# Patient Record
Sex: Male | Born: 1985 | Race: White | Hispanic: No | Smoking: Never smoker
Health system: Southern US, Community
[De-identification: ages and names within clinical notes are randomized; demographics above are authoritative.]

## PROBLEM LIST (undated history)

## (undated) DIAGNOSIS — R109 Unspecified abdominal pain: Secondary | ICD-10-CM

## (undated) DIAGNOSIS — N201 Calculus of ureter: Secondary | ICD-10-CM

## (undated) DIAGNOSIS — K5900 Constipation, unspecified: Secondary | ICD-10-CM

## (undated) DIAGNOSIS — N1330 Unspecified hydronephrosis: Secondary | ICD-10-CM

## (undated) DIAGNOSIS — Z87442 Personal history of urinary calculi: Secondary | ICD-10-CM

## (undated) DIAGNOSIS — G43909 Migraine, unspecified, not intractable, without status migrainosus: Secondary | ICD-10-CM

## (undated) HISTORY — PX: NASAL SEPTOPLASTY W/ TURBINOPLASTY: SHX2070

## (undated) HISTORY — PX: WISDOM TOOTH EXTRACTION: SHX21

---
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
## 2009-04-19 ENCOUNTER — Ambulatory Visit: Payer: Self-pay | Admitting: Radiology

## 2009-04-19 ENCOUNTER — Ambulatory Visit (HOSPITAL_BASED_OUTPATIENT_CLINIC_OR_DEPARTMENT_OTHER): Admission: RE | Admit: 2009-04-19 | Discharge: 2009-04-19 | Payer: Self-pay | Admitting: Family Medicine

## 2009-09-06 ENCOUNTER — Ambulatory Visit: Payer: Self-pay | Admitting: Diagnostic Radiology

## 2009-09-06 ENCOUNTER — Ambulatory Visit (HOSPITAL_BASED_OUTPATIENT_CLINIC_OR_DEPARTMENT_OTHER): Admission: RE | Admit: 2009-09-06 | Discharge: 2009-09-06 | Payer: Self-pay | Admitting: Family Medicine

## 2015-02-26 ENCOUNTER — Emergency Department
Admission: EM | Admit: 2015-02-26 | Discharge: 2015-02-26 | Disposition: A | Discharge status: Home / Self Care | Payer: Managed Care, Other (non HMO) | Source: Home / Self Care | Attending: Family Medicine | Admitting: Family Medicine

## 2015-02-26 ENCOUNTER — Encounter: Payer: Self-pay | Admitting: *Deleted

## 2015-02-26 DIAGNOSIS — J069 Acute upper respiratory infection, unspecified: Secondary | ICD-10-CM | POA: Diagnosis not present

## 2015-02-26 MED ORDER — BENZONATATE 100 MG PO CAPS: 100.0000 mg | ORAL_CAPSULE | Freq: Three times a day (TID) | ORAL | Status: DC

## 2015-02-26 MED ORDER — AZITHROMYCIN 250 MG PO TABS: 250.0000 mg | ORAL_TABLET | Freq: Every day | ORAL | Status: DC

## 2015-02-26 MED ORDER — DM-GUAIFENESIN ER 30-600 MG PO TB12: 1.0000 | ORAL_TABLET | Freq: Two times a day (BID) | ORAL | Status: DC

## 2015-02-26 NOTE — Discharge Instructions (Signed)
You may continue to try conservative treatment with Mucinex DM, Tessalon, and ibuprofen for fever or pain.  If symptoms are persistent, you may start the antibiotic, Azithromycin, in 4-5 days, if symptoms worsening including worsening cough, chest pain, or fever, you may start the antibiotic sooner.  Please take antibiotics as prescribed and be sure to complete entire course even if you start to feel better to ensure infection does not come back.   Upper Respiratory Infection, Adult Most upper respiratory infections (URIs) are a viral infection of the air passages leading to the lungs. A URI affects the nose, throat, and upper air passages. The most common type of URI is nasopharyngitis and is typically referred to as "the common cold." URIs run their course and usually go away on their own. Most of the time, a URI does not require medical attention, but sometimes a bacterial infection in the upper airways can follow a viral infection. This is called a secondary infection. Sinus and middle ear infections are common types of secondary upper respiratory infections. Bacterial pneumonia can also complicate a URI. A URI can worsen asthma and chronic obstructive pulmonary disease (COPD). Sometimes, these complications can require emergency medical care and may be life threatening.  CAUSES Almost all URIs are caused by viruses. A virus is a type of germ and can spread from one person to another.  RISKS FACTORS You may be at risk for a URI if:   You smoke.   You have chronic heart or lung disease.  You have a weakened defense (immune) system.   You are very young or very old.   You have nasal allergies or asthma.  You work in crowded or poorly ventilated areas.  You work in health care facilities or schools. SIGNS AND SYMPTOMS  Symptoms typically develop 2-3 days after you come in contact with a cold virus. Most viral URIs last 7-10 days. However, viral URIs from the influenza virus (flu  virus) can last 14-18 days and are typically more severe. Symptoms may include:   Runny or stuffy (congested) nose.   Sneezing.   Cough.   Sore throat.   Headache.   Fatigue.   Fever.   Loss of appetite.   Pain in your forehead, behind your eyes, and over your cheekbones (sinus pain).  Muscle aches.  DIAGNOSIS  Your health care provider may diagnose a URI by:  Physical exam.  Tests to check that your symptoms are not due to another condition such as:  Strep throat.  Sinusitis.  Pneumonia.  Asthma. TREATMENT  A URI goes away on its own with time. It cannot be cured with medicines, but medicines may be prescribed or recommended to relieve symptoms. Medicines may help:  Reduce your fever.  Reduce your cough.  Relieve nasal congestion. HOME CARE INSTRUCTIONS   Take medicines only as directed by your health care provider.   Gargle warm saltwater or take cough drops to comfort your throat as directed by your health care provider.  Use a warm mist humidifier or inhale steam from a shower to increase air moisture. This may make it easier to breathe.  Drink enough fluid to keep your urine clear or pale yellow.   Eat soups and other clear broths and maintain good nutrition.   Rest as needed.   Return to work when your temperature has returned to normal or as your health care provider advises. You may need to stay home longer to avoid infecting others. You can also use a  face mask and careful hand washing to prevent spread of the virus.  Increase the usage of your inhaler if you have asthma.   Do not use any tobacco products, including cigarettes, chewing tobacco, or electronic cigarettes. If you need help quitting, ask your health care provider. PREVENTION  The best way to protect yourself from getting a cold is to practice good hygiene.   Avoid oral or hand contact with people with cold symptoms.   Wash your hands often if contact occurs.   There is no clear evidence that vitamin C, vitamin E, echinacea, or exercise reduces the chance of developing a cold. However, it is always recommended to get plenty of rest, exercise, and practice good nutrition.  SEEK MEDICAL CARE IF:   You are getting worse rather than better.   Your symptoms are not controlled by medicine.   You have chills.  You have worsening shortness of breath.  You have brown or red mucus.  You have yellow or brown nasal discharge.  You have pain in your face, especially when you bend forward.  You have a fever.  You have swollen neck glands.  You have pain while swallowing.  You have white areas in the back of your throat. SEEK IMMEDIATE MEDICAL CARE IF:   You have severe or persistent:  Headache.  Ear pain.  Sinus pain.  Chest pain.  You have chronic lung disease and any of the following:  Wheezing.  Prolonged cough.  Coughing up blood.  A change in your usual mucus.  You have a stiff neck.  You have changes in your:  Vision.  Hearing.  Thinking.  Mood. MAKE SURE YOU:   Understand these instructions.  Will watch your condition.  Will get help right away if you are not doing well or get worse.   This information is not intended to replace advice given to you by your health care provider. Make sure you discuss any questions you have with your health care provider.   Document Released: 09/09/2000 Document Revised: 07/31/2014 Document Reviewed: 06/21/2013 Elsevier Interactive Patient Education 2016 [employer].  [employer] Vaporizers Vaporizers may help relieve the symptoms of a cough and cold. They add moisture to the air, which helps mucus to become thinner and less sticky. This makes it easier to breathe and cough up secretions. Cool mist vaporizers do not cause serious burns like hot mist vaporizers, which may also be called steamers or humidifiers. Vaporizers have not been proven to help with colds. You should  not use a vaporizer if you are allergic to mold. HOME CARE INSTRUCTIONS  Follow the package instructions for the vaporizer.  Do not use anything other than distilled water in the vaporizer.  Do not run the vaporizer all of the time. This can cause mold or bacteria to grow in the vaporizer.  Clean the vaporizer after each time it is used.  Clean and dry the vaporizer well before storing it.  Stop using the vaporizer if worsening respiratory symptoms develop.   This information is not intended to replace advice given to you by your health care provider. Make sure you discuss any questions you have with your health care provider.   Document Released: 12/12/2003 Document Revised: 03/21/2013 Document Reviewed: 08/03/2012 Elsevier Interactive Patient Education [employer]

## 2015-02-26 NOTE — ED Notes (Signed)
Pt c/o productive cough with some body aches x 5 days. He reports a fever the first 2 days.

## 2015-02-26 NOTE — ED Provider Notes (Signed)
CSN: [id number]     Arrival date & time 02/26/15  [id number] History   First MD Initiated Contact with Patient 02/26/15 [phone]     Chief Complaint  Patient presents with  . Cough   (Consider location/radiation/quality/duration/timing/severity/associated sxs/prior Treatment) HPI Pt is a 29yo male [facility] with c/o persistent mild to moderately productive cough for 5 days with associated body aches and "low grade fever" for 2 days.   Pt also c/o sore throat and a hoarse voice. Cough is most bothersome for pt as it causes near post-tussive vomiting.  He did try Mucinex once yesterday with no relief. He has also been taking motrin with minimal relief. Denies vomiting or diarrhea. Denies hx of asthma. No chest pain or SOB. Pt's girlfriend is also here to be seen for similar symptoms but pt states his girlfriend is sicker than he is. Denies recent travel.   History reviewed. No pertinent past medical history. Past Surgical History  Procedure Laterality Date  . Wisdom tooth extraction    . Nasal sinus surgery     Family History  Problem Relation Age of Onset  . Hypertension Mother    Social History  Substance Use Topics  . Smoking status: Never Smoker   . Smokeless tobacco: None  . Alcohol Use: Yes    Review of Systems  Constitutional: Positive for fever, chills and fatigue.  HENT: Positive for congestion, ear pain ( bilateral), postnasal drip, rhinorrhea, sinus pressure, sneezing, sore throat and voice change. Negative for trouble swallowing.   Respiratory: Positive for cough. Negative for shortness of breath.   Cardiovascular: Negative for chest pain and palpitations.  Gastrointestinal: Negative for nausea, vomiting, abdominal pain and diarrhea.  Musculoskeletal: Positive for myalgias and arthralgias. Negative for back pain.  Skin: Negative for rash.  Neurological: Positive for headaches. Negative for dizziness and light-headedness.    Allergies  Review of patient's allergies  indicates no known allergies.  Home Medications   Prior to Admission medications   Medication Sig Start Date End Date Taking? Authorizing Provider  ibuprofen (ADVIL,MOTRIN) 200 MG tablet Take 200 mg by mouth every 6 (six) hours as needed.   Yes Historical Provider, MD  azithromycin (ZITHROMAX) 250 MG tablet Take 1 tablet (250 mg total) by mouth daily. Take first 2 tablets together, then 1 every day until finished. 02/26/15   Junius FinnerErin O'Malley, PA-C  benzonatate (TESSALON) 100 MG capsule Take 1 capsule (100 mg total) by mouth every 8 (eight) hours. 02/26/15   Junius FinnerErin O'Malley, PA-C  dextromethorphan-guaiFENesin (MUCINEX DM) 30-600 MG 12hr tablet Take 1 tablet by mouth 2 (two) times daily. 02/26/15   Junius FinnerErin O'Malley, PA-C   Meds Ordered and Administered this Visit  Medications - No data to display  BP 118/79 mmHg  Pulse 84  Temp(Src) 97.8 F (36.6 C) (Oral)  Resp 18  Ht 5\' 10"  (1.778 m)  Wt 251 lb (113.853 kg)  BMI 36.01 kg/m2  SpO2 97% No data found.   Physical Exam  Constitutional: He appears well-developed and well-nourished.  HENT:  Head: Normocephalic and atraumatic.  Right Ear: Hearing, tympanic membrane, external ear and ear canal normal.  Left Ear: Hearing, tympanic membrane, external ear and ear canal normal.  Nose: Mucosal edema present. Right sinus exhibits no maxillary sinus tenderness and no frontal sinus tenderness. Left sinus exhibits no maxillary sinus tenderness and no frontal sinus tenderness.  Mouth/Throat: Uvula is midline and mucous membranes are normal. Posterior oropharyngeal erythema present. No oropharyngeal exudate, posterior oropharyngeal edema or tonsillar  abscesses.  Eyes: Conjunctivae are normal. No scleral icterus.  Neck: Normal range of motion. Neck supple.  Cardiovascular: Normal rate, regular rhythm and normal heart sounds.   Pulmonary/Chest: Effort normal and breath sounds normal. No respiratory distress. He has no wheezes. He has no rales. He exhibits no  tenderness.  Intermittent mildly productive cough on exam. No respiratory distress.  Abdominal: Soft. He exhibits no distension and no mass. There is no tenderness. There is no rebound and no guarding.  Musculoskeletal: Normal range of motion.  Lymphadenopathy:    He has no cervical adenopathy.  Neurological: He is alert.  Skin: Skin is warm and dry.  Nursing note and vitals reviewed.   ED Course  Procedures (including critical care time)  Labs Review Labs Reviewed - No data to display  Imaging Review No results found.    MDM   1. Acute upper respiratory infection    Pt c/o cough for 5 days with associated body aches and sore throat. Sick contacts. No recent travel. Vitals: WNL  Symptoms likely viral in nature, however, may be developing into a bacterial infection. Rx: Tessalon and Mucinex DM. Advised he may continue to take Motrin.  Prescribed Azithromycin for pt to start in 4-5 days if not improving, sooner if symptoms worsening such as chest pain, fever, or worsening cough. F/u with PCP in 7-10 days if not improving. Patient verbalized understanding and agreement with treatment plan.    Junius Finner, PA-C 02/26/15 [phone]

## 2015-05-31 ENCOUNTER — Emergency Department
Admission: EM | Admit: 2015-05-31 | Discharge: 2015-05-31 | Discharge status: Absent without leave | Payer: Managed Care, Other (non HMO) | Source: Home / Self Care

## 2015-06-01 ENCOUNTER — Emergency Department
Admission: EM | Admit: 2015-06-01 | Discharge: 2015-06-01 | Disposition: A | Discharge status: Home / Self Care | Payer: Managed Care, Other (non HMO) | Source: Home / Self Care | Attending: Family Medicine | Admitting: Family Medicine

## 2015-06-01 ENCOUNTER — Encounter: Payer: Self-pay | Admitting: Emergency Medicine

## 2015-06-01 DIAGNOSIS — B9789 Other viral agents as the cause of diseases classified elsewhere: Principal | ICD-10-CM

## 2015-06-01 DIAGNOSIS — J069 Acute upper respiratory infection, unspecified: Secondary | ICD-10-CM | POA: Diagnosis not present

## 2015-06-01 MED ORDER — GUAIFENESIN-CODEINE 100-10 MG/5ML PO SOLN: ORAL | Status: DC

## 2015-06-01 MED ORDER — AZITHROMYCIN 250 MG PO TABS: 250.0000 mg | ORAL_TABLET | Freq: Every day | ORAL | Status: DC

## 2015-06-01 NOTE — Discharge Instructions (Signed)
Take plain guaifenesin (1200mg  extended release tabs such as Mucinex) twice daily, with plenty of water, for cough and congestion.  May add Pseudoephedrine (30mg , one or two every 4 to 6 hours) for sinus congestion.  Get adequate rest.   Try warm salt water gargles for sore throat.  Stop all antihistamines for now, and other non-prescription cough/cold preparations. May take Ibuprofen 200mg , 4 tabs every 8 hours with food for chest/sternum discomfort. Begin Azithromycin if not improving about one week or if persistent fever develops (Given a prescription to hold, with an expiration date)  Follow-up with family doctor if not improving about10 days.

## 2015-06-01 NOTE — ED Provider Notes (Signed)
CSN: [id number]     Arrival date & time 06/01/15  [id number] History   First MD Initiated Contact with Patient 06/01/15 [phone]     Chief Complaint  Patient presents with  . Nasal Congestion  . Cough  . Generalized Body Aches      HPI Comments: Patient complains of six day history of typical cold-like symptoms developing over several days,  including mild sore throat, headache, fatigue, and cough.  He has had minimal sinus congestion. The cough persists, and he complains of tightness in his anterior chest.  He denies fevers, chills, and sweats.  The history is provided by the patient.    Past Medical History  Diagnosis Date  . Thyroid disease    Past Surgical History  Procedure Laterality Date  . Wisdom tooth extraction    . Nasal sinus surgery     Family History  Problem Relation Age of Onset  . Hypertension Mother   . Diabetes Mother    Social History  Substance Use Topics  . Smoking status: Never Smoker   . Smokeless tobacco: None  . Alcohol Use: Yes    Review of Systems + sore throat + cough No pleuritic pain ? wheezing + nasal congestion + post-nasal drainage No sinus pain/pressure No itchy/red eyes No earache No hemoptysis ? SOB No fever/chills No nausea No vomiting No abdominal pain No diarrhea No urinary symptoms No skin rash + fatigue No myalgias + headache Used OTC meds without relief  Allergies  Review of patient's allergies indicates no known allergies.  Home Medications   Prior to Admission medications   Medication Sig Start Date End Date Taking? Authorizing Provider  azithromycin (ZITHROMAX) 250 MG tablet Take 1 tablet (250 mg total) by mouth daily. Take two tabs together on day 1, then 1 daily on days 2 through 5 (Rx void after 06/09/15) 06/01/15   Lattie Haw, MD  guaiFENesin-codeine 100-10 MG/5ML syrup Take 10mL by mouth at bedtime as needed for cough 06/01/15   Lattie Haw, MD  ibuprofen (ADVIL,MOTRIN) 200 MG tablet Take 200 mg by mouth  every 6 (six) hours as needed.    Historical Provider, MD   Meds Ordered and Administered this Visit  Medications - No data to display  BP 113/80 mmHg  Pulse 85  Temp(Src) 97.7 F (36.5 C) (Oral)  Resp 16  Ht  (1.778 m)  Wt 243 lb (110.224 kg)  BMI 34.87 kg/m2  SpO2 96% No data found.   Physical Exam Nursing notes and Vital Signs reviewed. Appearance:  Patient appears stated age, and in no acute distress.  Patient is obese (BMI 34.9) Eyes:  Pupils are equal, round, and reactive to light and accomodation.  Extraocular movement is intact.  Conjunctivae are not inflamed  Ears:  Canals normal.  Tympanic membranes normal.  Nose:  Congested turbinates.  No sinus tenderness.    Pharynx:   Mild swelling uvula Neck:  Supple.  Tender enlarged posterior nodes are palpated bilaterally  Lungs:  Clear to auscultation.  Breath sounds are equal.  Moving air well. Heart:  Regular rate and rhythm without murmurs, rubs, or gallops.  Abdomen:  Nontender without masses or hepatosplenomegaly.  Bowel sounds are present.  No CVA or flank tenderness.  Extremities:  No edema.  Skin:  No rash present.   ED Course  Procedures none    MDM   1. Viral URI with cough    There is no evidence of bacterial infection today.  Rx  for Robitussin AC for night time cough.  Take plain guaifenesin (1200mg  extended release tabs such as Mucinex) twice daily, with plenty of water, for cough and congestion.  May add Pseudoephedrine (30mg , one or two every 4 to 6 hours) for sinus congestion.  Get adequate rest.   Try warm salt water gargles for sore throat.  Stop all antihistamines for now, and other non-prescription cough/cold preparations. May take Ibuprofen 200mg , 4 tabs every 8 hours with food for chest/sternum discomfort. Begin Azithromycin if not improving about one week or if persistent fever develops (Given a prescription to hold, with an expiration date)  Follow-up with family doctor if not improving  about10 days.     Lattie HawStephen A Kahliyah Dick, MD 06/01/15 1002

## 2015-06-01 NOTE — ED Notes (Signed)
Reports 6 day history of congestion and cough not responding to OTCS; some aches and sore throat; no documented fever. No OTCs today.

## 2017-01-03 ENCOUNTER — Encounter (HOSPITAL_BASED_OUTPATIENT_CLINIC_OR_DEPARTMENT_OTHER): Payer: Self-pay | Admitting: *Deleted

## 2017-01-03 ENCOUNTER — Emergency Department (HOSPITAL_BASED_OUTPATIENT_CLINIC_OR_DEPARTMENT_OTHER)
Admission: EM | Admit: 2017-01-03 | Discharge: 2017-01-03 | Disposition: A | Discharge status: Home / Self Care | Payer: Commercial Managed Care - PPO | Attending: Emergency Medicine | Admitting: Emergency Medicine

## 2017-01-03 ENCOUNTER — Emergency Department (HOSPITAL_BASED_OUTPATIENT_CLINIC_OR_DEPARTMENT_OTHER): Payer: Commercial Managed Care - PPO

## 2017-01-03 DIAGNOSIS — R112 Nausea with vomiting, unspecified: Secondary | ICD-10-CM | POA: Diagnosis not present

## 2017-01-03 DIAGNOSIS — N132 Hydronephrosis with renal and ureteral calculous obstruction: Secondary | ICD-10-CM | POA: Diagnosis not present

## 2017-01-03 DIAGNOSIS — R109 Unspecified abdominal pain: Secondary | ICD-10-CM | POA: Diagnosis present

## 2017-01-03 LAB — CBC
HEMATOCRIT: 44.4 % (ref 39.0–52.0)
Hemoglobin: 15.6 g/dL (ref 13.0–17.0)
MCH: 29.5 pg (ref 26.0–34.0)
MCHC: 35.1 g/dL (ref 30.0–36.0)
MCV: 84.1 fL (ref 78.0–100.0)
PLATELETS: 358 10*3/uL (ref 150–400)
RBC: 5.28 MIL/uL (ref 4.22–5.81)
RDW: 12.8 % (ref 11.5–15.5)
WBC: 8.2 10*3/uL (ref 4.0–10.5)

## 2017-01-03 LAB — URINALYSIS, ROUTINE W REFLEX MICROSCOPIC

## 2017-01-03 LAB — COMPREHENSIVE METABOLIC PANEL
ALT: 34 U/L (ref 17–63)
AST: 33 U/L (ref 15–41)
Albumin: 4.7 g/dL (ref 3.5–5.0)
Alkaline Phosphatase: 56 U/L (ref 38–126)
Anion gap: 8 (ref 5–15)
BILIRUBIN TOTAL: 0.5 mg/dL (ref 0.3–1.2)
BUN: 15 mg/dL (ref 6–20)
CALCIUM: 9.2 mg/dL (ref 8.9–10.3)
CHLORIDE: 106 mmol/L (ref 101–111)
CO2: 24 mmol/L (ref 22–32)
Creatinine, Ser: 0.92 mg/dL (ref 0.61–1.24)
GLUCOSE: 106 mg/dL — AB (ref 65–99)
POTASSIUM: 3.3 mmol/L — AB (ref 3.5–5.1)
Sodium: 138 mmol/L (ref 135–145)
Total Protein: 7.5 g/dL (ref 6.5–8.1)

## 2017-01-03 LAB — LIPASE, BLOOD: Lipase: 38 U/L (ref 11–51)

## 2017-01-03 LAB — URINALYSIS, MICROSCOPIC (REFLEX)

## 2017-01-03 MED ORDER — ONDANSETRON 4 MG PO TBDP: 4.0000 mg | ORAL_TABLET | Freq: Three times a day (TID) | ORAL | 0 refills | Status: AC | PRN

## 2017-01-03 MED ORDER — KETOROLAC TROMETHAMINE 30 MG/ML IJ SOLN
30.0000 mg | Freq: Once | INTRAMUSCULAR | Status: AC
  Administered 2017-01-03: 30 mg via INTRAVENOUS
  Filled 2017-01-03: qty 1

## 2017-01-03 MED ORDER — CEPHALEXIN 500 MG PO CAPS: 500.0000 mg | ORAL_CAPSULE | Freq: Four times a day (QID) | ORAL | 0 refills | Status: AC

## 2017-01-03 MED ORDER — HYDROMORPHONE HCL 1 MG/ML IJ SOLN
1.0000 mg | Freq: Once | INTRAMUSCULAR | Status: AC
  Administered 2017-01-03: 1 mg via INTRAVENOUS
  Filled 2017-01-03: qty 1

## 2017-01-03 MED ORDER — SODIUM CHLORIDE 0.9 % IV BOLUS (SEPSIS)
1000.0000 mL | Freq: Once | INTRAVENOUS | Status: AC
  Administered 2017-01-03: 1000 mL via INTRAVENOUS

## 2017-01-03 MED ORDER — KETOROLAC TROMETHAMINE 10 MG PO TABS: 10.0000 mg | ORAL_TABLET | Freq: Four times a day (QID) | ORAL | 0 refills | Status: AC | PRN

## 2017-01-03 MED ORDER — ONDANSETRON HCL 4 MG/2ML IJ SOLN
4.0000 mg | Freq: Once | INTRAMUSCULAR | Status: AC
  Administered 2017-01-03: 4 mg via INTRAVENOUS
  Filled 2017-01-03: qty 2

## 2017-01-03 NOTE — ED Triage Notes (Signed)
Right flank pain radiating into right testicle x 2 days. Denies urinary symptoms. Last BM today. Denies frank blood in urine. Vomit x 2

## 2017-01-03 NOTE — ED Notes (Signed)
ED Provider at bedside. 

## 2017-01-03 NOTE — ED Notes (Signed)
Patient claimed that he could not get comfortable.

## 2017-01-03 NOTE — ED Provider Notes (Signed)
[facility] Provider Note   CSN: [id number] Arrival date & time: 01/03/17  1323     History   Chief Complaint Chief Complaint  Patient presents with  . Flank Pain    HPI Russell Gilbert is a 31 y.o. male who presents to the Emergency Department with right flank pain that began as mild, dull pain 2 days ago, but suddenly significantly worsened this afternoon as constant, sharp, severe pain. He also complains of several episodes of non-bilious, non-bloody emesis and nausea since the pain worsened. The pain radiates to the right groin. He denies fever, chills, abdominal pain, back pain, dysuria, frequency, diarrhea, hematuria, testicular or penile pain or swelling, or penile discharge. No left sided symptoms. He has taken ibuprofen without relief. No h/o of similar sx.  No pertinent PMH. No h/o of nephrolithiasis. He reports a family hx of nephrolithiasis with his mother.   The history is provided by the patient. No language interpreter was used.    Past Medical History:  Diagnosis Date  . Constipation   . Hydronephrosis, right   . Migraine   . Right flank pain   . Right ureteral stone     There are no active problems to display for this patient.   Past Surgical History:  Procedure Laterality Date  . NASAL SEPTOPLASTY W/ TURBINOPLASTY  01-10-2015   dr d. Christell Constant @ [facility]  . WISDOM TOOTH EXTRACTION         Home Medications    Prior to Admission medications   Medication Sig Start Date End Date Taking? Authorizing Provider  acetaminophen (TYLENOL) 500 MG tablet Take 500 mg by mouth every 6 (six) hours as needed.    [provider]  cephALEXin (KEFLEX) 500 MG capsule Take 1 capsule (500 mg total) by mouth 4 (four) times daily. 01/03/17 01/08/17  McDonald, Mia A, PA-C  Ibuprofen (ADVIL) 200 MG CAPS Take by mouth as needed.    [provider]  ketorolac (TORADOL) 10 MG tablet Take 1 tablet (10 mg total) by mouth every 6 (six) hours as needed. 01/03/17    McDonald, Mia A, PA-C  ondansetron (ZOFRAN ODT) 4 MG disintegrating tablet Take 1 tablet (4 mg total) by mouth every 8 (eight) hours as needed for nausea or vomiting. 01/03/17   McDonald, Mia A, PA-C    Family History Family History  Problem Relation Age of Onset  . Hypertension Mother   . Diabetes Mother     Social History Social History  Substance Use Topics  . Smoking status: Never Smoker  . Smokeless tobacco: Never Used  . Alcohol use Yes     Comment: occasional     Allergies   Patient has no known allergies.   Review of Systems Review of Systems  Constitutional: Negative for activity change, chills and fever.  Respiratory: Negative for shortness of breath.   Cardiovascular: Negative for chest pain.  Gastrointestinal: Positive for nausea and vomiting. Negative for abdominal pain and diarrhea.  Genitourinary: Positive for flank pain. Negative for decreased urine volume, discharge, dysuria, frequency, hematuria, penile pain, penile swelling, scrotal swelling, testicular pain and urgency.  Musculoskeletal: Negative for back pain.  Skin: Negative for rash.     Physical Exam Updated Vital Signs BP 132/88 (BP Location: Right Wrist)   Pulse 68   Temp 98.2 F (36.8 C) (Oral)   Resp 18   Ht  (1.803 m)   Wt 117.9 kg (260 lb)   SpO2 98%   BMI 36.26  kg/m   Physical Exam  Constitutional: He appears well-developed.  Grabbing his right flank and changing positions frequently; uncomfortable appearing.   HENT:  Head: Normocephalic.  Eyes: Conjunctivae are normal.  Neck: Neck supple.  Cardiovascular: Normal rate and regular rhythm.   No murmur heard. Pulmonary/Chest: Effort normal.  Abdominal: Soft. He exhibits no distension. No hernia.  TTP over the right flank. No CVA tenderness bilaterally. Minimal RLQ tenderness. No rebound or guarding. Normoactive BS. No left flank tenderness.   Genitourinary:  Genitourinary Comments: Chaperoned exam. No TTP of the penis or  scrotum; no overlying erythema, swelling, or lesions. No inguinal hernias. No palpable inguinal lymphadenopathy. TTP over the right inguinal area.   Neurological: He is alert.  Skin: Skin is warm and dry.  Psychiatric: His behavior is normal.  Nursing note and vitals reviewed.    ED Treatments / Results  Labs (all labs ordered are listed, but only abnormal results are displayed) Labs Reviewed  URINALYSIS, ROUTINE W REFLEX MICROSCOPIC - Abnormal; Notable for the following:       Result Value   Color, Urine BROWN (*)    APPearance TURBID (*)    Glucose, UA   (*)    Value: TEST NOT REPORTED DUE TO COLOR INTERFERENCE OF URINE PIGMENT   Hgb urine dipstick   (*)    Value: TEST NOT REPORTED DUE TO COLOR INTERFERENCE OF URINE PIGMENT   Bilirubin Urine   (*)    Value: TEST NOT REPORTED DUE TO COLOR INTERFERENCE OF URINE PIGMENT   Ketones, ur   (*)    Value: TEST NOT REPORTED DUE TO COLOR INTERFERENCE OF URINE PIGMENT   Protein, ur   (*)    Value: TEST NOT REPORTED DUE TO COLOR INTERFERENCE OF URINE PIGMENT   Nitrite   (*)    Value: TEST NOT REPORTED DUE TO COLOR INTERFERENCE OF URINE PIGMENT   Leukocytes, UA   (*)    Value: TEST NOT REPORTED DUE TO COLOR INTERFERENCE OF URINE PIGMENT   All other components within normal limits  COMPREHENSIVE METABOLIC PANEL - Abnormal; Notable for the following:    Potassium 3.3 (*)    Glucose, Bld 106 (*)    All other components within normal limits  URINALYSIS, MICROSCOPIC (REFLEX) - Abnormal; Notable for the following:    Bacteria, UA MANY (*)    Squamous Epithelial / LPF 0-5 (*)    All other components within normal limits  CBC  LIPASE, BLOOD    EKG  EKG Interpretation None       Radiology Ct Renal Stone Study  Result Date: 01/03/2017 CLINICAL DATA:  Right flank pain for 2 days radiating to the right groin EXAM: CT ABDOMEN AND PELVIS WITHOUT CONTRAST TECHNIQUE: Multidetector CT imaging of the abdomen and pelvis was performed following  the standard protocol without IV contrast. COMPARISON:  09/06/2009 unenhanced CT abdomen/pelvis. FINDINGS: Lower chest: No significant pulmonary nodules or acute consolidative airspace disease. Hepatobiliary: Normal liver size. Subcentimeter hypodense posterior right liver lobe lesion (Series 2/image 21), too small to characterize, not definitely seen on the prior unenhanced CT, which requires no follow up unless the patient has risk factors for liver malignancy. No additional liver lesions. Normal gallbladder with no radiopaque cholelithiasis. No biliary ductal dilatation. Pancreas: Normal, with no mass or duct dilation. Spleen: Normal size. No mass. Adrenals/Urinary Tract: Normal adrenals. Obstructing 5 mm right lumbar ureteral stone at the L4-5 disc level, with mild to moderate right hydroureteronephrosis, mild asymmetric enlargement of the  right kidney and minimal right perinephric fat stranding. No stones within the renal collecting systems. No left hydronephrosis. No contour deforming renal masses. Normal caliber left ureter. No additional ureteral stones. Collapsed and grossly normal bladder. Stomach/Bowel: Grossly normal stomach. Normal caliber small bowel with no small bowel wall thickening. Normal appendix. Normal large bowel with no diverticulosis, large bowel wall thickening or pericolonic fat stranding. Vascular/Lymphatic: Normal caliber abdominal aorta. No pathologically enlarged lymph nodes in the abdomen or pelvis. Reproductive: Normal size prostate. Other: No pneumoperitoneum, ascites or focal fluid collection. Musculoskeletal: No aggressive appearing focal osseous lesions. IMPRESSION: Obstructing 5 mm right lumbar ureteral stone at the L4-5 disc level, with mild to moderate right hydroureteronephrosis. No additional urolithiasis. Electronically Signed   By: Delbert Phenix M.D.   On: 01/03/2017 15:46    Procedures Procedures (including critical care time)  Medications Ordered in ED Medications    HYDROmorphone (DILAUDID) injection 1 mg (1 mg Intravenous Given 01/03/17 1402)  sodium chloride 0.9 % bolus 1,000 mL (0 mLs Intravenous Stopped 01/03/17 1415)  ondansetron (ZOFRAN) injection 4 mg (4 mg Intravenous Given 01/03/17 1402)  ketorolac (TORADOL) 30 MG/ML injection 30 mg (30 mg Intravenous Given 01/03/17 1502)     Initial Impression / Assessment and Plan / ED Course  I have reviewed the triage vital signs and the nursing notes.  Pertinent labs & imaging results that were available during my care of the patient were reviewed by me and considered in my medical decision making (see chart for details).     31 year old male with no significant PMH with right flank pain for 2 days, significantly worsening today w/ N/V. Dilaudid and zofran given. Nausea and emesis resolved, but the patient's pain was not improved. Cr 0.92. Toradol given with resolution of the patient's pain. Ct stone study with 5 mm right lumbar ureteral stone at L4-5 disc level with mild to moderate right hydroureteronephrosis. UA with many bacteria and calcium oxylate crystals noted. The patient has no dysuria, frequency, hesitancy, or suprapubic pain. No fever or chills. No tachycardia. Doubt infected stone at this time. Will d/c the patient with Toradol and follow up to urology. Discussed the patient's UA with Dr. Denton Lank who recommended giving the patient a prescription of Keflex in case he started developing urinary symptoms prior to being seen by urology. Strict return precautions given. NAD. The patient is safe for d/c at this time.   Final Clinical Impressions(s) / ED Diagnoses   Final diagnoses:  Ureteral stone with hydronephrosis    New Prescriptions Discharge Medication List as of 01/03/2017  5:42 PM    START taking these medications   Details  cephALEXin (KEFLEX) 500 MG capsule Take 1 capsule (500 mg total) by mouth 4 (four) times daily., Starting Sun 01/03/2017, Until Fri 01/08/2017, Print    ketorolac  (TORADOL) 10 MG tablet Take 1 tablet (10 mg total) by mouth every 6 (six) hours as needed., Starting Sun 01/03/2017, Print    ondansetron (ZOFRAN ODT) 4 MG disintegrating tablet Take 1 tablet (4 mg total) by mouth every 8 (eight) hours as needed for nausea or vomiting., Starting Sun 01/03/2017, Print         McDonald, Mia A, PA-C 01/04/17 2053    Arby Barrette, MD 01/05/17 1544

## 2017-01-03 NOTE — Discharge Instructions (Signed)
Call Dr. Shannan Harper office tomorrow morning to schedule a follow-up appointment.  1 tablet of oral Toradol to be taken once every 6 hours as needed for pain. Please do not take more than once every 6 hours. If you are unable to control your pain using this medication, please return to the emergency department for reevaluation. One tablet of Zofran to be taken once every 8 hours as needed for nausea or vomiting.   If he develop new or worsening symptoms, including a fever that is not resolved with Tylenol, chills, nausea or vomiting despite taking Zofran, or uncontrollable pain despite taking Toradol, please return to the emergency department for reevaluation.

## 2017-01-04 ENCOUNTER — Encounter (HOSPITAL_BASED_OUTPATIENT_CLINIC_OR_DEPARTMENT_OTHER): Payer: Self-pay | Admitting: *Deleted

## 2017-01-04 ENCOUNTER — Other Ambulatory Visit: Payer: Self-pay | Admitting: Urology

## 2017-01-04 NOTE — Progress Notes (Signed)
SPOKE W/ PT WIFE.  NPO AFTER MN.  ARRIVE AT 0915.  CURRENT LAB RESULTS IN CHART AND EPIC.  MAY TAKE PRN RX IF NEEDED W/ SIPS OF WATER AM DOS.

## 2017-01-06 ENCOUNTER — Encounter (HOSPITAL_BASED_OUTPATIENT_CLINIC_OR_DEPARTMENT_OTHER): Payer: Self-pay | Admitting: *Deleted

## 2017-01-06 ENCOUNTER — Encounter (HOSPITAL_BASED_OUTPATIENT_CLINIC_OR_DEPARTMENT_OTHER)
Admission: RE | Disposition: A | Discharge status: Home / Self Care | Payer: Self-pay | Source: Ambulatory Visit | Attending: Urology

## 2017-01-06 ENCOUNTER — Ambulatory Visit (HOSPITAL_BASED_OUTPATIENT_CLINIC_OR_DEPARTMENT_OTHER): Payer: Commercial Managed Care - PPO | Admitting: Anesthesiology

## 2017-01-06 ENCOUNTER — Ambulatory Visit (HOSPITAL_BASED_OUTPATIENT_CLINIC_OR_DEPARTMENT_OTHER)
Admission: RE | Admit: 2017-01-06 | Discharge: 2017-01-06 | Disposition: A | Discharge status: Home / Self Care | Payer: Commercial Managed Care - PPO | Source: Ambulatory Visit | Attending: Urology | Admitting: Urology

## 2017-01-06 DIAGNOSIS — K5900 Constipation, unspecified: Secondary | ICD-10-CM | POA: Diagnosis not present

## 2017-01-06 DIAGNOSIS — G43909 Migraine, unspecified, not intractable, without status migrainosus: Secondary | ICD-10-CM | POA: Diagnosis not present

## 2017-01-06 DIAGNOSIS — N132 Hydronephrosis with renal and ureteral calculous obstruction: Secondary | ICD-10-CM | POA: Diagnosis not present

## 2017-01-06 DIAGNOSIS — Z6834 Body mass index (BMI) 34.0-34.9, adult: Secondary | ICD-10-CM | POA: Insufficient documentation

## 2017-01-06 DIAGNOSIS — Z833 Family history of diabetes mellitus: Secondary | ICD-10-CM | POA: Insufficient documentation

## 2017-01-06 DIAGNOSIS — Z8249 Family history of ischemic heart disease and other diseases of the circulatory system: Secondary | ICD-10-CM | POA: Insufficient documentation

## 2017-01-06 DIAGNOSIS — Z87442 Personal history of urinary calculi: Secondary | ICD-10-CM | POA: Diagnosis present

## 2017-01-06 HISTORY — DX: Constipation, unspecified: K59.00

## 2017-01-06 HISTORY — DX: Unspecified hydronephrosis: N13.30

## 2017-01-06 HISTORY — PX: HOLMIUM LASER APPLICATION: SHX5852

## 2017-01-06 HISTORY — DX: Calculus of ureter: N20.1

## 2017-01-06 HISTORY — DX: Unspecified abdominal pain: R10.9

## 2017-01-06 HISTORY — PX: CYSTOSCOPY WITH RETROGRADE PYELOGRAM, URETEROSCOPY AND STENT PLACEMENT: SHX5789

## 2017-01-06 HISTORY — DX: Migraine, unspecified, not intractable, without status migrainosus: G43.909

## 2017-01-06 HISTORY — DX: Personal history of urinary calculi: Z87.442

## 2017-01-06 SURGERY — CYSTOURETEROSCOPY, WITH RETROGRADE PYELOGRAM AND STENT INSERTION
Anesthesia: General | Site: Ureter | Laterality: Right

## 2017-01-06 MED ORDER — DEXAMETHASONE SODIUM PHOSPHATE 10 MG/ML IJ SOLN
INTRAMUSCULAR | Status: DC | PRN
  Administered 2017-01-06: 10 mg via INTRAVENOUS

## 2017-01-06 MED ORDER — ONDANSETRON HCL 4 MG/2ML IJ SOLN
INTRAMUSCULAR | Status: AC
  Filled 2017-01-06: qty 2

## 2017-01-06 MED ORDER — SCOPOLAMINE 1 MG/3DAYS TD PT72
1.0000 | MEDICATED_PATCH | TRANSDERMAL | Status: DC
  Administered 2017-01-06: 1.5 mg via TRANSDERMAL
  Filled 2017-01-06: qty 1

## 2017-01-06 MED ORDER — LIDOCAINE 2% (20 MG/ML) 5 ML SYRINGE
INTRAMUSCULAR | Status: DC | PRN
  Administered 2017-01-06: 100 mg via INTRAVENOUS

## 2017-01-06 MED ORDER — CEFAZOLIN SODIUM-DEXTROSE 2-4 GM/100ML-% IV SOLN
2.0000 g | INTRAVENOUS | Status: AC
  Administered 2017-01-06: 2 g via INTRAVENOUS
  Filled 2017-01-06: qty 100

## 2017-01-06 MED ORDER — CEFAZOLIN SODIUM-DEXTROSE 2-4 GM/100ML-% IV SOLN
INTRAVENOUS | Status: AC
  Filled 2017-01-06: qty 100

## 2017-01-06 MED ORDER — FENTANYL CITRATE (PF) 100 MCG/2ML IJ SOLN
INTRAMUSCULAR | Status: DC | PRN
  Administered 2017-01-06 (×2): 50 ug via INTRAVENOUS

## 2017-01-06 MED ORDER — SODIUM CHLORIDE 0.9 % IR SOLN
Status: DC | PRN
  Administered 2017-01-06: 1000 mL
  Administered 2017-01-06: 3000 mL

## 2017-01-06 MED ORDER — MIDAZOLAM HCL 2 MG/2ML IJ SOLN
INTRAMUSCULAR | Status: DC | PRN
Start: 2017-01-06 — End: 2017-01-06
  Administered 2017-01-06: 2 mg via INTRAVENOUS

## 2017-01-06 MED ORDER — SCOPOLAMINE 1 MG/3DAYS TD PT72
MEDICATED_PATCH | TRANSDERMAL | Status: AC
  Filled 2017-01-06: qty 1

## 2017-01-06 MED ORDER — KETOROLAC TROMETHAMINE 30 MG/ML IJ SOLN
INTRAMUSCULAR | Status: DC | PRN
  Administered 2017-01-06: 30 mg via INTRAVENOUS

## 2017-01-06 MED ORDER — SULFAMETHOXAZOLE-TRIMETHOPRIM 800-160 MG PO TABS: 1.0000 | ORAL_TABLET | Freq: Two times a day (BID) | ORAL | 0 refills | Status: AC

## 2017-01-06 MED ORDER — PROPOFOL 10 MG/ML IV BOLUS
INTRAVENOUS | Status: DC | PRN
  Administered 2017-01-06: 200 mg via INTRAVENOUS

## 2017-01-06 MED ORDER — HYDROCODONE-ACETAMINOPHEN 5-325 MG PO TABS: 1.0000 | ORAL_TABLET | ORAL | 0 refills | Status: AC | PRN

## 2017-01-06 MED ORDER — ONDANSETRON HCL 4 MG PO TABS: 4.0000 mg | ORAL_TABLET | Freq: Every day | ORAL | 1 refills | Status: AC | PRN

## 2017-01-06 MED ORDER — MIDAZOLAM HCL 2 MG/2ML IJ SOLN
INTRAMUSCULAR | Status: AC
  Filled 2017-01-06: qty 2

## 2017-01-06 MED ORDER — HYDROMORPHONE HCL 1 MG/ML IJ SOLN
0.2500 mg | INTRAMUSCULAR | Status: DC | PRN
  Filled 2017-01-06: qty 0.5

## 2017-01-06 MED ORDER — ONDANSETRON HCL 4 MG/2ML IJ SOLN
INTRAMUSCULAR | Status: DC | PRN
  Administered 2017-01-06: 4 mg via INTRAVENOUS

## 2017-01-06 MED ORDER — PROPOFOL 10 MG/ML IV BOLUS
INTRAVENOUS | Status: AC
  Filled 2017-01-06: qty 20

## 2017-01-06 MED ORDER — LIDOCAINE 2% (20 MG/ML) 5 ML SYRINGE
INTRAMUSCULAR | Status: AC
  Filled 2017-01-06: qty 5

## 2017-01-06 MED ORDER — IOHEXOL 300 MG/ML  SOLN
INTRAMUSCULAR | Status: DC | PRN
  Administered 2017-01-06: 4 mL via URETHRAL

## 2017-01-06 MED ORDER — KETOROLAC TROMETHAMINE 30 MG/ML IJ SOLN
INTRAMUSCULAR | Status: AC
  Filled 2017-01-06: qty 1

## 2017-01-06 MED ORDER — FENTANYL CITRATE (PF) 100 MCG/2ML IJ SOLN
INTRAMUSCULAR | Status: AC
  Filled 2017-01-06: qty 2

## 2017-01-06 MED ORDER — LACTATED RINGERS IV SOLN
INTRAVENOUS | Status: DC
  Administered 2017-01-06: 10:00:00 via INTRAVENOUS
  Filled 2017-01-06: qty 1000

## 2017-01-06 MED ORDER — PROMETHAZINE HCL 25 MG/ML IJ SOLN
6.2500 mg | INTRAMUSCULAR | Status: DC | PRN
  Filled 2017-01-06: qty 1

## 2017-01-06 MED ORDER — DEXAMETHASONE SODIUM PHOSPHATE 10 MG/ML IJ SOLN
INTRAMUSCULAR | Status: AC
  Filled 2017-01-06: qty 1

## 2017-01-06 SURGICAL SUPPLY — 20 items
BAG DRAIN URO-CYSTO SKYTR STRL (DRAIN) ×2 IMPLANT
BAG DRN UROCATH (DRAIN) ×1
BASKET ZERO TIP NITINOL 2.4FR (BASKET) ×2 IMPLANT
BSKT STON RTRVL ZERO TP 2.4FR (BASKET) ×1
CATH INTERMIT  6FR 70CM (CATHETERS) ×1 IMPLANT
CLOTH BEACON ORANGE TIMEOUT ST (SAFETY) ×2 IMPLANT
FIBER LASER FLEXIVA 365 (UROLOGICAL SUPPLIES) ×1 IMPLANT
GLOVE BIO SURGEON STRL SZ7.5 (GLOVE) ×2 IMPLANT
GOWN STRL REUS W/TWL XL LVL3 (GOWN DISPOSABLE) ×1 IMPLANT
GUIDEWIRE ANG ZIPWIRE 038X150 (WIRE) ×2 IMPLANT
INFUSOR MANOMETER BAG 3000ML (MISCELLANEOUS) ×2 IMPLANT
IV NS 1000ML (IV SOLUTION) ×2
IV NS 1000ML BAXH (IV SOLUTION) IMPLANT
IV NS IRRIG 3000ML ARTHROMATIC (IV SOLUTION) ×2 IMPLANT
KIT RM TURNOVER CYSTO AR (KITS) ×2 IMPLANT
MANIFOLD NEPTUNE II (INSTRUMENTS) ×2 IMPLANT
PACK CYSTO (CUSTOM PROCEDURE TRAY) ×2 IMPLANT
STENT URET 6FRX26 CONTOUR (STENTS) ×1 IMPLANT
SYRINGE 10CC LL (SYRINGE) ×2 IMPLANT
TUBE CONNECTING 12X1/4 (SUCTIONS) ×1 IMPLANT

## 2017-01-06 NOTE — Op Note (Signed)
Operative Note  Preoperative diagnosis:  1. 5 mm right mid-ureteral stone  Postoperative diagnosis: 1. 5 mm right mid-ureteral stone  Procedure(s): 1. Cystoscopy 2.  Right retrograde pyelogram with intra-op interpretation of fluoroscopic imaging 3.  Right ureteroscopy 4.  Holmium laser lithotripsy 5.  Right JJ stent placement  Surgeon: Rhoderick Moody, MD  Anesthesia: General  Complications: None  EBL: <5 mL  Specimens: 1. Right ureteral stone  Drains/Catheters: 1. Right 6 French JJ stent  Intraoperative findings: Obstructing right mid-ureteral stone  Indication: Russell Gilbert is a 31 y.o. male who presented to the office on 01/04/17 with right flank pain.  He had a CTSS in the emergency department on (01/03/17) that demonstrated a 5 mm right mid-ureteral stone with mild hydronephrosis.   Description of procedure: After informed consent was obtained, the patient was brought to the operating room and general LMA anesthesia was administered. The patient was then placed in the dorsolithotomy position and prepped and draped in usual sterile fashion. A timeout was performed. A 21 French rigid cystoscope was then inserted into the urethral meatus and advanced into the bladder under direct vision. A complete bladder survey revealed no intravesical pathology.  A 6 French open-ended catheter was then inserted into the right ureteral orifice. A right retrograde pyelogram was then performed and demonstrated a filling defect consistent with an obstructing stone seen in the right mid ureter with dilation of the proximal ureter. There was crisp outlining of all right renal calyces with no other filling defects seen within the renal pelvis.  A semirigid ureteroscope was then inserted into the urethral meatus and advanced into the bladder. Scope was then navigated into the right ureter where his 5 mm stone was identified. A 365  holmium laser was then used to fracture the stone into several  smaller pieces. A tip this basket was then used to extract all stone fragments from the lumen of the right ureter. The rigid ureteroscope was then removed under direct vision.  The rigid cystoscope was then advanced over the wire and into position within the bladder. A 6 [place] JJ stent was advanced over the wire and into position within the right collecting system, confirming excellent placement via fluoroscopy. The patient's bladder was then drained and all stone fragments were removed. He tolerated the procedure well and was transferred to the postanesthesia in stable condition.  Plan: Follow-up in 1 week for cystoscopy and stent removal the office

## 2017-01-06 NOTE — H&P (Signed)
Urology Preoperative H&P   Chief Complaint: Right flank pain  History of Present Illness: Russell Gilbert is a 30 y.o. male with right flank pain.  CTSS (01/03/17)- 5 mm right mid-ureteral stone with mild hydronephrosis.   He reports a 24 hour history of right flank/groin pain that is alleviated with toradol. Some nausea that is controlled with zofran. Appears comfortable today. Denies fever/chills, dysuria or hematuria. No prior stone episodes.     Past Medical History:  Diagnosis Date  . Constipation   . Hydronephrosis, right   . Migraine   . Right flank pain   . Right ureteral stone     Past Surgical History:  Procedure Laterality Date  . NASAL SEPTOPLASTY W/ TURBINOPLASTY  01-10-2015   dr d. Christell Constant @ [facility]  . WISDOM TOOTH EXTRACTION      Allergies: No Known Allergies  Family History  Problem Relation Age of Onset  . Hypertension Mother   . Diabetes Mother     Social History:  reports that he has never smoked. He has never used smokeless tobacco. He reports that he drinks alcohol. He reports that he does not use drugs.  ROS: A complete review of systems was performed.  All systems are negative except for pertinent findings as noted.  Physical Exam:  Vital signs in last 24 hours:   Constitutional:  Alert and oriented, No acute distress Cardiovascular: Regular rate and rhythm, No JVD Respiratory: Normal respiratory effort, Lungs clear bilaterally GI: Abdomen is soft, nontender, nondistended, no abdominal masses GU: No CVA tenderness Lymphatic: No lymphadenopathy Neurologic: Grossly intact, no focal deficits Psychiatric: Normal mood and affect  Laboratory Data:   Recent Labs  01/03/17 1349  WBC 8.2  HGB 15.6  HCT 44.4  PLT 358     Recent Labs  01/03/17 1359  NA 138  K 3.3*  CL 106  GLUCOSE 106*  BUN 15  CALCIUM 9.2  CREATININE 0.92     No results found for this or any previous visit (from the past 24 hour(s)). No results found for this or any  previous visit (from the past 240 hour(s)).  Renal Function:  Recent Labs  01/03/17 1359  CREATININE 0.92   Estimated Creatinine Clearance: 153.7 mL/min (by C-G formula based on SCr of 0.92 mg/dL).  Radiologic Imaging: No results found.  I independently reviewed the above imaging studies.  Assessment and Plan Russell Gilbert is a 31 y.o. male with a 5 mm right mid-ureteral stone with mild hydronephrosis  -The risks, benefits and alternatives of cystoscopy with right ureteroscopy, laser lithotripsy and right JJ stent placement was discussed with the patient.  He voices understanding and wishes to proceed.  Rhoderick Moody, MD 01/06/2017, 7:48 AM  [facility] Pager: [phone]

## 2017-01-06 NOTE — Anesthesia Postprocedure Evaluation (Signed)
Anesthesia Post Note  Patient: Russell Gilbert  Procedure(s) Performed: CYSTOSCOPY WITH RETROGRADE PYELOGRAM, URETEROSCOPY/STONE EXTRACTION/ LASET LITHOTRIPSY AND STENT PLACEMENT (Right Ureter) HOLMIUM LASER APPLICATION (Right Ureter)     Patient location during evaluation: PACU Anesthesia Type: General Level of consciousness: sedated Pain management: pain level controlled Vital Signs Assessment: post-procedure vital signs reviewed and stable Respiratory status: spontaneous breathing and respiratory function stable Cardiovascular status: stable Postop Assessment: no apparent nausea or vomiting Anesthetic complications: no    Last Vitals:  Vitals:   01/06/17 1130 01/06/17 1145  BP: 128/87 128/88  Pulse: 75 76  Resp: 16 14  Temp:    SpO2: 95% 95%    Last Pain:  Vitals:   01/06/17 1054  TempSrc:   PainSc: Asleep                 Rayshun Kandler DANIEL

## 2017-01-06 NOTE — Anesthesia Procedure Notes (Signed)
Procedure Name: LMA Insertion Date/Time: 01/06/2017 10:15 AM Performed by: Tyrone Nine Pre-anesthesia Checklist: Patient identified, Timeout performed, Emergency Drugs available, Patient being monitored and Suction available Patient Re-evaluated:Patient Re-evaluated prior to induction Oxygen Delivery Method: Circle system utilized Preoxygenation: Pre-oxygenation with 100% oxygen Induction Type: IV induction Ventilation: Mask ventilation without difficulty LMA: LMA inserted LMA Size: 4.0 Number of attempts: 1 Placement Confirmation: breath sounds checked- equal and bilateral,  CO2 detector and positive ETCO2 Tube secured with: Tape Dental Injury: Teeth and Oropharynx as per pre-operative assessment

## 2017-01-06 NOTE — Anesthesia Preprocedure Evaluation (Addendum)
Anesthesia Evaluation  Patient identified by MRN, date of birth, ID band Patient awake    Reviewed: Allergy & Precautions, NPO status , Patient's Chart, lab work & pertinent test results  Airway Mallampati: II  TM Distance: >3 FB Neck ROM: Full    Dental no notable dental hx. (+) Dental Advisory Given, Teeth Intact   Pulmonary neg pulmonary ROS,    Pulmonary exam normal        Cardiovascular Exercise Tolerance: Good negative cardio ROS Normal cardiovascular exam     Neuro/Psych  Headaches, negative neurological ROS  negative psych ROS   GI/Hepatic negative GI ROS, Neg liver ROS,   Endo/Other  negative endocrine ROSMorbid obesity  Renal/GU Renal diseasenegative Renal ROS  negative genitourinary   Musculoskeletal negative musculoskeletal ROS (+)   Abdominal   Peds negative pediatric ROS (+)  Hematology negative hematology ROS (+)   Anesthesia Other Findings   Reproductive/Obstetrics negative OB ROS                            Anesthesia Physical Anesthesia Plan  ASA: II  Anesthesia Plan: General   Post-op Pain Management:    Induction: Intravenous  PONV Risk Score and Plan: 3 and Ondansetron, Dexamethasone and Scopolamine patch - Pre-op  Airway Management Planned: LMA  Additional Equipment:   Intra-op Plan:   Post-operative Plan: Extubation in OR  Informed Consent: I have reviewed the patients History and Physical, chart, labs and discussed the procedure including the risks, benefits and alternatives for the proposed anesthesia with the patient or authorized representative who has indicated his/her understanding and acceptance.   Dental advisory given  Plan Discussed with: CRNA, Anesthesiologist and Surgeon  Anesthesia Plan Comments:         Anesthesia Quick Evaluation

## 2017-01-06 NOTE — Interval H&P Note (Signed)
History and Physical Interval Note:  01/06/2017 9:41 AM  Russell Gilbert  has presented today for surgery, with the diagnosis of RIGHT URETERAL STONE  The various methods of treatment have been discussed with the patient and family. After consideration of risks, benefits and other options for treatment, the patient has consented to  Procedure(s) with comments: CYSTOSCOPY WITH RETROGRADE PYELOGRAM, URETEROSCOPY AND STENT PLACEMENT (Right) - 45 MINS [phone] [id number] HOLMIUM LASER APPLICATION (Right) as a surgical intervention .  The patient's history has been reviewed, patient examined, no change in status, stable for surgery.  I have reviewed the patient's chart and labs.  Questions were answered to the patient's satisfaction.     Dorian Furnace Tilley Faeth

## 2017-01-06 NOTE — Transfer of Care (Signed)
Immediate Anesthesia Transfer of Care Note  Patient: Russell Gilbert  Procedure(s) Performed: CYSTOSCOPY WITH RETROGRADE PYELOGRAM, URETEROSCOPY/STONE EXTRACTION/ LASET LITHOTRIPSY AND STENT PLACEMENT (Right Ureter) HOLMIUM LASER APPLICATION (Right Ureter)  Patient Location: PACU  Anesthesia Type:General  Level of Consciousness: awake, alert , oriented and patient cooperative  Airway & Oxygen Therapy: Patient Spontanous Breathing and Patient connected to nasal cannula oxygen  Post-op Assessment: Report given to RN and Post -op Vital signs reviewed and stable  Post vital signs: Reviewed and stable  Last Vitals:  Vitals:   01/06/17 0910  BP: 133/81  Pulse: 85  Resp: 16  Temp: 37.2 C  SpO2: 99%    Last Pain:  Vitals:   01/06/17 0910  TempSrc: Oral         Complications: No apparent anesthesia complications

## 2017-01-06 NOTE — Discharge Instructions (Signed)
Post Anesthesia Home Care Instructions  Activity: Get plenty of rest for the remainder of the day. A responsible individual must stay with you for 24 hours following the procedure.  For the next 24 hours, DO NOT: -Drive a car -Operate machinery -Drink alcoholic beverages -Take any medication unless instructed by your physician -Make any legal decisions or sign important papers.  Meals: Start with liquid foods such as gelatin or soup. Progress to regular foods as tolerated. Avoid greasy, spicy, heavy foods. If nausea and/or vomiting occur, drink only clear liquids until the nausea and/or vomiting subsides. Call your physician if vomiting continues.  Special Instructions/Symptoms: Your throat may feel dry or sore from the anesthesia or the breathing tube placed in your throat during surgery. If this causes discomfort, gargle with warm salt water. The discomfort should disappear within 24 hours.  If you had a scopolamine patch placed behind your ear for the management of post- operative nausea and/or vomiting:  1. The medication in the patch is effective for 72 hours, after which it should be removed.  Wrap patch in a tissue and discard in the trash. Wash hands thoroughly with soap and water. 2. You may remove the patch earlier than 72 hours if you experience unpleasant side effects which may include dry mouth, dizziness or visual disturbances. 3. Avoid touching the patch. Wash your hands with soap and water after contact with the patch.      [facility] [phone] Post Ureteroscopy With or Without Stent Instructions  Definitions:  Ureter: The duct that transports urine from the kidney to the bladder. Stent:   A plastic hollow tube that is placed into the ureter, from the kidney to the bladder to prevent the ureter from swelling shut.  GENERAL INSTRUCTIONS:  Despite the fact that no skin incisions were used, the area around the ureter and bladder is raw and  irritated. The stent is a foreign body which will further irritate the bladder wall. This irritation is manifested by increased frequency of urination, both day and night, and by an increase in the urge to urinate. In some, the urge to urinate is present almost always. Sometimes the urge is strong enough that you may not be able to stop yourself from urinating. The only real cure is to remove the stent and then give time for the bladder wall to heal which can't be done until the danger of the ureter swelling shut has passed, which varies.  You may see some blood in your urine while the stent is in place and a few days afterwards. Do not be alarmed, even if the urine was clear for a while. Get off your feet and drink lots of fluids until clearing occurs. If you start to pass clots or don't improve, call us.  DIET: You may return to your normal diet immediately. Because of the raw surface of your bladder, alcohol, spicy foods, acid type foods and drinks with caffeine may cause irritation or frequency and should be used in moderation. To keep your urine flowing freely and to avoid constipation, drink plenty of fluids during the day ( 8-10 glasses ). Tip: Avoid cranberry juice because it is very acidic.  ACTIVITY: Your physical activity doesn't need to be restricted. However, if you are very active, you may see some blood in your urine. We suggest that you reduce your activity under these circumstances until the bleeding has stopped.  BOWELS: It is important to keep your bowels regular during the postoperative period. Straining   with bowel movements can cause bleeding. A bowel movement every other day is reasonable. Use a mild laxative if needed, such as Milk of Magnesia 2-3 tablespoons, or 2 Dulcolax tablets. Call if you continue to have problems. If you have been taking narcotics for pain, before, during or after your surgery, you may be constipated. Take a laxative if necessary.   MEDICATION: You  should resume your pre-surgery medications unless told not to. In addition you will often be given an antibiotic to prevent infection. These should be taken as prescribed until the bottles are finished unless you are having an unusual reaction to one of the drugs.  PROBLEMS YOU SHOULD REPORT TO US: Fevers over 100.5 Fahrenheit. Heavy bleeding, or clots ( See above notes about blood in urine ). Inability to urinate. Drug reactions ( hives, rash, nausea, vomiting, diarrhea ). Severe burning or pain with urination that is not improving.  FOLLOW-UP: You will need a follow-up appointment to monitor your progress. Call for this appointment at the number listed above. Usually the first appointment will be about three to fourteen days after your surgery.      

## 2017-01-07 ENCOUNTER — Encounter (HOSPITAL_BASED_OUTPATIENT_CLINIC_OR_DEPARTMENT_OTHER): Payer: Self-pay | Admitting: Urology

## 2017-04-25 ENCOUNTER — Other Ambulatory Visit: Payer: Self-pay

## 2017-04-25 ENCOUNTER — Emergency Department
Admission: EM | Admit: 2017-04-25 | Discharge: 2017-04-25 | Disposition: A | Discharge status: Home / Self Care | Payer: Commercial Managed Care - PPO | Source: Home / Self Care | Attending: Family Medicine | Admitting: Family Medicine

## 2017-04-25 DIAGNOSIS — J069 Acute upper respiratory infection, unspecified: Secondary | ICD-10-CM | POA: Diagnosis not present

## 2017-04-25 DIAGNOSIS — Z23 Encounter for immunization: Secondary | ICD-10-CM

## 2017-04-25 DIAGNOSIS — J040 Acute laryngitis: Secondary | ICD-10-CM

## 2017-04-25 MED ORDER — AZITHROMYCIN 250 MG PO TABS: 250.0000 mg | ORAL_TABLET | Freq: Every day | ORAL | 0 refills | Status: DC

## 2017-04-25 MED ORDER — TETANUS-DIPHTH-ACELL PERTUSSIS 5-2.5-18.5 LF-MCG/0.5 IM SUSP
0.5000 mL | Freq: Once | INTRAMUSCULAR | Status: AC
  Administered 2017-04-25: 0.5 mL via INTRAMUSCULAR

## 2017-04-25 NOTE — ED Provider Notes (Signed)
[facility]    CSN: [id number] Arrival date & time: 04/25/17  1108     History   Chief Complaint Chief Complaint  Patient presents with  . Cough  . Sore Throat  . Nasal Congestion    HPI Russell Gilbert is a 32 y.o. male.   HPI Russell Gilbert is a 32 y.o. male presenting to UC with c/o 2 weeks of persistent mildly productive cough that started to improve but then worsened.  A few days ago he developed body aches, sore throat and sinus pressure.  Voice is hoarse. His wife was sick recently but is now better. Pt denies fever, chills, n/v/d.    Past Medical History:  Diagnosis Date  . Constipation   . History of kidney stones   . Hydronephrosis, right   . Migraine   . Right flank pain   . Right ureteral stone     There are no active problems to display for this patient.   Past Surgical History:  Procedure Laterality Date  . CYSTOSCOPY WITH RETROGRADE PYELOGRAM, URETEROSCOPY AND STENT PLACEMENT Right 01/06/2017   Procedure: CYSTOSCOPY WITH RETROGRADE PYELOGRAM, URETEROSCOPY/STONE EXTRACTION/ LASET LITHOTRIPSY AND STENT PLACEMENT;  Surgeon: Rene Paci, MD;  Location: [facility];  Service: Urology;  Laterality: Right;  45 MINS [phone] [id number]  . HOLMIUM LASER APPLICATION Right 01/06/2017   Procedure: HOLMIUM LASER APPLICATION;  Surgeon: Rene Paci, MD;  Location: [facility];  Service: Urology;  Laterality: Right;  . NASAL SEPTOPLASTY W/ TURBINOPLASTY  01-10-2015   dr d. Christell Constant @ [facility]  . WISDOM TOOTH EXTRACTION         Home Medications    Prior to Admission medications   Medication Sig Start Date End Date Taking? Authorizing Provider  acetaminophen (TYLENOL) 500 MG tablet Take 500 mg by mouth every 6 (six) hours as needed.    [provider]  azithromycin (ZITHROMAX) 250 MG tablet Take 1 tablet (250 mg total) by mouth daily. Take first 2 tablets together, then 1 every day until  finished. 04/25/17   Lurene Shadow, PA-C  HYDROcodone-acetaminophen (NORCO) 5-325 MG tablet Take 1 tablet by mouth every 4 (four) hours as needed for moderate pain. 01/06/17   Rene Paci, MD  Ibuprofen (ADVIL) 200 MG CAPS Take by mouth as needed.    [provider]  ketorolac (TORADOL) 10 MG tablet Take 1 tablet (10 mg total) by mouth every 6 (six) hours as needed. 01/03/17   McDonald, Mia A, PA-C  ondansetron (ZOFRAN ODT) 4 MG disintegrating tablet Take 1 tablet (4 mg total) by mouth every 8 (eight) hours as needed for nausea or vomiting. 01/03/17   McDonald, Mia A, PA-C  ondansetron (ZOFRAN) 4 MG tablet Take 1 tablet (4 mg total) by mouth daily as needed for nausea or vomiting. 01/06/17 01/06/18  Rene Paci, MD    Family History Family History  Problem Relation Age of Onset  . Hypertension Mother   . Diabetes Mother     Social History Social History   Tobacco Use  . Smoking status: Never Smoker  . Smokeless tobacco: Never Used  Substance Use Topics  . Alcohol use: Yes    Comment: occasional  . Drug use: No     Allergies   Patient has no known allergies.   Review of Systems Review of Systems  Constitutional: Negative for chills and fever.  HENT: Positive for congestion, postnasal drip, sinus pressure, sore throat and voice  change. Negative for ear pain and trouble swallowing.   Respiratory: Positive for cough. Negative for shortness of breath.   Cardiovascular: Negative for chest pain and palpitations.  Gastrointestinal: Negative for abdominal pain, diarrhea, nausea and vomiting.  Musculoskeletal: Positive for arthralgias, back pain and myalgias.       Body aches  Skin: Negative for rash.     Physical Exam Triage Vital Signs ED Triage Vitals  Enc Vitals Group     BP 04/25/17 1132 132/81     Pulse Rate 04/25/17 1132 76     Resp --      Temp 04/25/17 1132 98.1 F (36.7 C)     Temp Source 04/25/17 1132 Oral     SpO2 04/25/17  1132 97 %     Weight 04/25/17 1133 259 lb (117.5 kg)     Height 04/25/17 1133 5\' 10"  (1.778 m)     Head Circumference --      Peak Flow --      Pain Score 04/25/17 1133 5     Pain Loc --      Pain Edu? --      Excl. in GC? --    No data found.  Updated Vital Signs BP 132/81 (BP Location: Right Arm)   Pulse 76   Temp 98.1 F (36.7 C) (Oral)   Ht 5\' 10"  (1.778 m)   Wt 259 lb (117.5 kg)   SpO2 97%   BMI 37.16 kg/m   Visual Acuity Right Eye Distance:   Left Eye Distance:   Bilateral Distance:    Right Eye Near:   Left Eye Near:    Bilateral Near:     Physical Exam  Constitutional: He is oriented to person, place, and time. He appears well-developed and well-nourished.  Non-toxic appearance. He does not appear ill. No distress.  HENT:  Head: Normocephalic and atraumatic.  Right Ear: Tympanic membrane normal.  Left Ear: Tympanic membrane normal.  Nose: Mucosal edema present. Right sinus exhibits no maxillary sinus tenderness and no frontal sinus tenderness. Left sinus exhibits no maxillary sinus tenderness and no frontal sinus tenderness.  Mouth/Throat: Uvula is midline and mucous membranes are normal. Posterior oropharyngeal erythema present.  Eyes: EOM are normal.  Neck: Normal range of motion. Neck supple.  Cardiovascular: Normal rate.  Pulmonary/Chest: Effort normal and breath sounds normal. No stridor. No respiratory distress. He has no wheezes. He has no rhonchi. He has no rales.  Hoarse voice but no stridor  Musculoskeletal: Normal range of motion.  Lymphadenopathy:    He has cervical adenopathy.  Neurological: He is alert and oriented to person, place, and time.  Skin: Skin is warm and dry.  Psychiatric: He has a normal mood and affect. His behavior is normal.  Nursing note and vitals reviewed.    UC Treatments / Results  Labs (all labs ordered are listed, but only abnormal results are displayed) Labs Reviewed - No data to display  EKG  EKG  Interpretation None       Radiology No results found.  Procedures Procedures (including critical care time)  Medications Ordered in UC Medications  Tdap (BOOSTRIX) injection 0.5 mL (0.5 mLs Intramuscular Given 04/25/17 1142)     Initial Impression / Assessment and Plan / UC Course  I have reviewed the triage vital signs and the nursing notes.  Pertinent labs & imaging results that were available during my care of the patient were reviewed by me and considered in my medical decision making (see chart  for details).     Due to duration of symptoms will cover for atypical bacterial Discussed home care treatment as well F/u with PCP in 1 week if not improving.  Pt expecting his first baby in April and is requesting a Tdap. He does not currently have a PCP but is looking for one.   Tdap given in UC as pt is afebrile and not having vomiting or diarrhea.   Final Clinical Impressions(s) / UC Diagnoses   Final diagnoses:  Upper respiratory tract infection, unspecified type  Laryngitis    ED Discharge Orders        Ordered    azithromycin (ZITHROMAX) 250 MG tablet  Daily     04/25/17 1139       Controlled Substance Prescriptions [place] Controlled Substance Registry consulted? Not Applicable   Rolla Plate 04/25/17 1246

## 2017-04-25 NOTE — ED Triage Notes (Signed)
Cough began about 2 weeks ago, then patient started feeling better.  Last Tuesday, achy and sore throat.  Friday became hoarse, and cough started.

## 2018-02-25 ENCOUNTER — Emergency Department
Admission: EM | Admit: 2018-02-25 | Discharge: 2018-02-25 | Disposition: A | Discharge status: Home / Self Care | Payer: Commercial Managed Care - PPO | Source: Home / Self Care | Attending: Family Medicine | Admitting: Family Medicine

## 2018-02-25 ENCOUNTER — Other Ambulatory Visit: Payer: Self-pay

## 2018-02-25 DIAGNOSIS — J069 Acute upper respiratory infection, unspecified: Secondary | ICD-10-CM | POA: Diagnosis not present

## 2018-02-25 MED ORDER — BENZONATATE 100 MG PO CAPS: 100.0000 mg | ORAL_CAPSULE | Freq: Three times a day (TID) | ORAL | 0 refills | Status: AC

## 2018-02-25 MED ORDER — AZITHROMYCIN 250 MG PO TABS: 250.0000 mg | ORAL_TABLET | Freq: Every day | ORAL | 0 refills | Status: AC

## 2018-02-25 NOTE — ED Triage Notes (Signed)
Pt c/o cold sxs x 1.5 weeks. Productive cough with green/yellow mucous. Sore throat with post nasal drainage. Woke up with 101 fever. Took ibuprofen at 5am. Also taking mucinex prn.

## 2018-02-25 NOTE — ED Provider Notes (Signed)
[name]    CSN: [id number] Arrival date & time: 02/25/18  0905     History   Chief Complaint Chief Complaint  Patient presents with  . Cough    HPI Russell Gilbert is a 32 y.o. male.   HPI Russell Gilbert is a 32 y.o. male presenting to UC with c/o cough and congestion for 11 days, worsening suddenly last night with a severe coughing episode, and waking this morning with HA, body aches and fever 101*F.  Body aches and fever resolved after taking ibuprofen at 5AM. He is also taking mucinex for his productive cough with mild relief.  His wife and 5mo old daughter have also been sick. Pt denies chest pain or SOB.   Past Medical History:  Diagnosis Date  . Constipation   . History of kidney stones   . Hydronephrosis, right   . Migraine   . Right flank pain   . Right ureteral stone     There are no active problems to display for this patient.   Past Surgical History:  Procedure Laterality Date  . CYSTOSCOPY WITH RETROGRADE PYELOGRAM, URETEROSCOPY AND STENT PLACEMENT Right 01/06/2017   Procedure: CYSTOSCOPY WITH RETROGRADE PYELOGRAM, URETEROSCOPY/STONE EXTRACTION/ LASET LITHOTRIPSY AND STENT PLACEMENT;  Surgeon: Rene Paci, MD;  Location: [facility];  Service: Urology;  Laterality: Right;  45 MINS [phone] [id number]  . HOLMIUM LASER APPLICATION Right 01/06/2017   Procedure: HOLMIUM LASER APPLICATION;  Surgeon: Rene Paci, MD;  Location: [facility];  Service: Urology;  Laterality: Right;  . NASAL SEPTOPLASTY W/ TURBINOPLASTY  01-10-2015   dr d. Christell Constant @ [facility]  . WISDOM TOOTH EXTRACTION         Home Medications    Prior to Admission medications   Medication Sig Start Date End Date Taking? Authorizing Provider  acetaminophen (TYLENOL) 500 MG tablet Take 500 mg by mouth every 6 (six) hours as needed.    [provider]  azithromycin (ZITHROMAX) 250 MG tablet Take 1 tablet (250 mg  total) by mouth daily. Take first 2 tablets together, then 1 every day until finished. 02/25/18   Lurene Shadow, PA-C  benzonatate (TESSALON) 100 MG capsule Take 1-2 capsules (100-200 mg total) by mouth every 8 (eight) hours. 02/25/18   Lurene Shadow, PA-C  HYDROcodone-acetaminophen (NORCO) 5-325 MG tablet Take 1 tablet by mouth every 4 (four) hours as needed for moderate pain. 01/06/17   Rene Paci, MD  Ibuprofen (ADVIL) 200 MG CAPS Take by mouth as needed.    [provider]  ketorolac (TORADOL) 10 MG tablet Take 1 tablet (10 mg total) by mouth every 6 (six) hours as needed. 01/03/17   McDonald, Mia A, PA-C  ondansetron (ZOFRAN ODT) 4 MG disintegrating tablet Take 1 tablet (4 mg total) by mouth every 8 (eight) hours as needed for nausea or vomiting. 01/03/17   McDonald, Mia A, PA-C    Family History Family History  Problem Relation Age of Onset  . Hypertension Mother   . Diabetes Mother     Social History Social History   Tobacco Use  . Smoking status: Never Smoker  . Smokeless tobacco: Never Used  Substance Use Topics  . Alcohol use: Yes    Comment: occasional  . Drug use: No     Allergies   Patient has no known allergies.   Review of Systems Review of Systems  Constitutional: Positive for chills and fever.  HENT: Positive for congestion.  Negative for ear pain, sore throat, trouble swallowing and voice change.   Respiratory: Positive for cough. Negative for shortness of breath.   Cardiovascular: Negative for chest pain and palpitations.  Gastrointestinal: Negative for abdominal pain, diarrhea, nausea and vomiting.  Musculoskeletal: Positive for arthralgias, back pain and myalgias.  Skin: Negative for rash.  Neurological: Positive for headaches. Negative for dizziness and light-headedness.     Physical Exam Triage Vital Signs ED Triage Vitals [02/25/18 0926]  Enc Vitals Group     BP 128/80     Pulse Rate 79     Resp      Temp 98.1 F (36.7  C)     Temp Source Oral     SpO2 96 %     Weight 257 lb (116.6 kg)     Height 5\' 10"  (1.778 m)     Head Circumference      Peak Flow      Pain Score 0     Pain Loc      Pain Edu?      Excl. in GC?    No data found.  Updated Vital Signs BP 128/80 (BP Location: Right Arm)   Pulse 79   Temp 98.1 F (36.7 C) (Oral)   Ht 5\' 10"  (1.778 m)   Wt 257 lb (116.6 kg)   SpO2 96%   BMI 36.88 kg/m   Visual Acuity Right Eye Distance:   Left Eye Distance:   Bilateral Distance:    Right Eye Near:   Left Eye Near:    Bilateral Near:     Physical Exam  Constitutional: He appears well-developed and well-nourished. No distress.  HENT:  Head: Normocephalic and atraumatic.  Right Ear: Tympanic membrane normal.  Left Ear: Tympanic membrane normal.  Nose: Nose normal. Right sinus exhibits no maxillary sinus tenderness and no frontal sinus tenderness. Left sinus exhibits no maxillary sinus tenderness and no frontal sinus tenderness.  Mouth/Throat: Uvula is midline, oropharynx is clear and moist and mucous membranes are normal.  Eyes: Conjunctivae are normal. No scleral icterus.  Neck: Normal range of motion. Neck supple.  Cardiovascular: Normal rate and regular rhythm.  Pulmonary/Chest: Effort normal and breath sounds normal. No stridor. No respiratory distress. He has no wheezes. He has no rales.  Musculoskeletal: Normal range of motion.  Neurological: He is alert.  Skin: Skin is warm and dry. He is not diaphoretic.  Nursing note and vitals reviewed.    UC Treatments / Results  Labs (all labs ordered are listed, but only abnormal results are displayed) Labs Reviewed - No data to display  EKG None  Radiology No results found.  Procedures Procedures (including critical care time)  Medications Ordered in UC Medications - No data to display  Initial Impression / Assessment and Plan / UC Course  I have reviewed the triage vital signs and the nursing notes.  Pertinent labs &  imaging results that were available during my care of the patient were reviewed by me and considered in my medical decision making (see chart for details).     Hx and exam c/w URI, likely viral developing into secondary bacterial infection given new onset fever. Will tx with azithromycing  Final Clinical Impressions(s) / UC Diagnoses   Final diagnoses:  Upper respiratory tract infection, unspecified type     Discharge Instructions      You may take 500mg  acetaminophen every 4-6 hours or in combination with ibuprofen 400-600mg  every 6-8 hours as needed for pain, inflammation, and  fever.  Be sure to stay well hydrated and get at least 8 hours of sleep at night, preferably more while sick.    Please take antibiotics as prescribed and be sure to complete entire course even if you start to feel better to ensure infection does not come back.  Please follow up with family medicine in 1 week as needed.    ED Prescriptions    Medication Sig Dispense Auth. Provider   azithromycin (ZITHROMAX) 250 MG tablet Take 1 tablet (250 mg total) by mouth daily. Take first 2 tablets together, then 1 every day until finished. 6 tablet Doroteo Glassman, Maicee Ullman O, PA-C   benzonatate (TESSALON) 100 MG capsule Take 1-2 capsules (100-200 mg total) by mouth every 8 (eight) hours. 21 capsule Lurene Shadow, PA-C     Controlled Substance Prescriptions [place] Controlled Substance Registry consulted? Not Applicable   Rolla Plate 02/25/18 1015

## 2018-02-25 NOTE — Discharge Instructions (Signed)
°  You may take 500mg  acetaminophen every 4-6 hours or in combination with ibuprofen 400-600mg  every 6-8 hours as needed for pain, inflammation, and fever.  Be sure to stay well hydrated and get at least 8 hours of sleep at night, preferably more while sick.    Please take antibiotics as prescribed and be sure to complete entire course even if you start to feel better to ensure infection does not come back.  Please follow up with family medicine in 1 week as needed.

## 2018-02-28 ENCOUNTER — Telehealth: Payer: Self-pay | Admitting: Emergency Medicine

## 2019-05-03 IMAGING — CT CT RENAL STONE PROTOCOL
2 of 4 series · 16 of 46 positions shown, 18 images · non-contrast
Comparison: 09/06/2009 unenhanced CT abdomen/pelvis.

CLINICAL DATA: Right flank pain for 2 days radiating to the right
groin

EXAM:
CT ABDOMEN AND PELVIS WITHOUT CONTRAST
TECHNIQUE: Multidetector CT imaging of the abdomen and pelvis was performed
following the standard protocol without IV contrast.

[Series 2: axial st · axial · 0.87mm/px · z∈[-566,-76]mm · 13 of 108 slices shown, 15 images]
[im 5/108  soft-tissue]
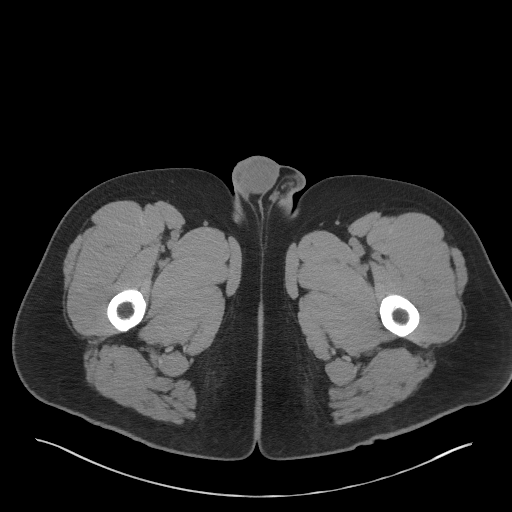
[im 5/108  bone]
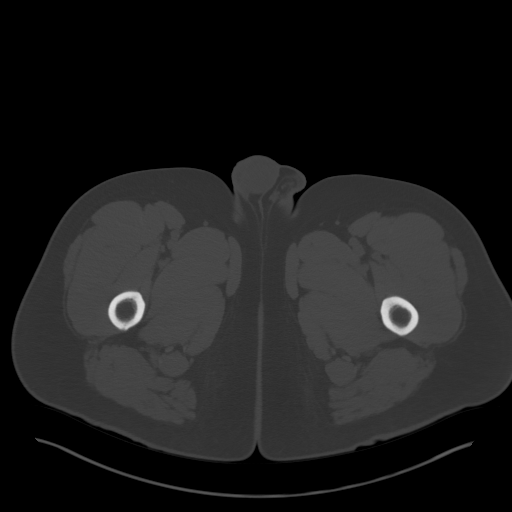
[im 13/108  soft-tissue]
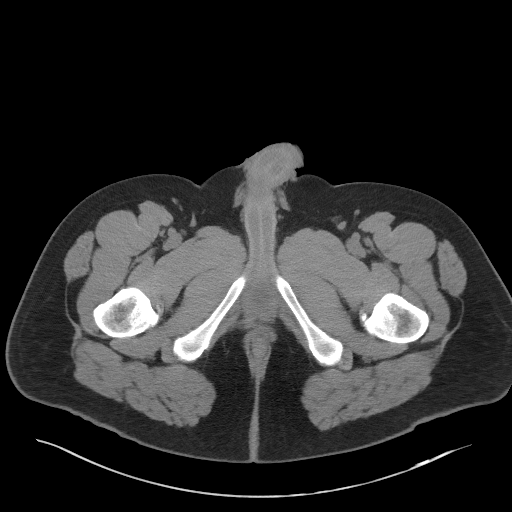
[im 21/108  soft-tissue]
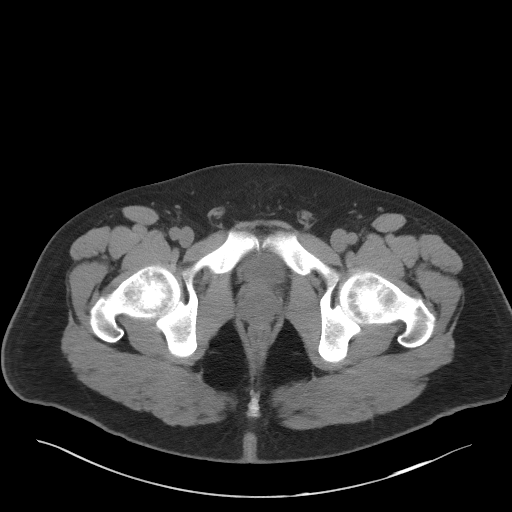
[im 29/108  soft-tissue]
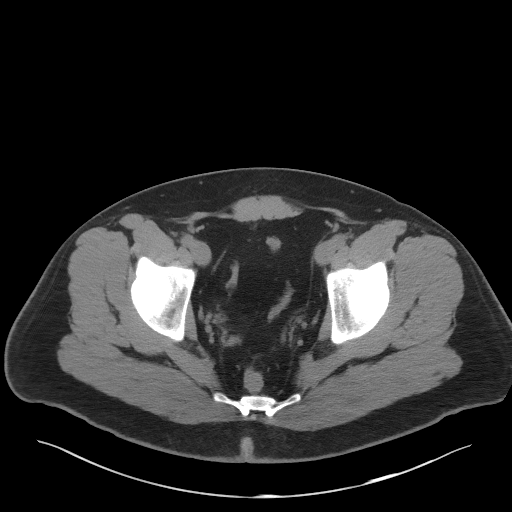
[im 38/108  soft-tissue]
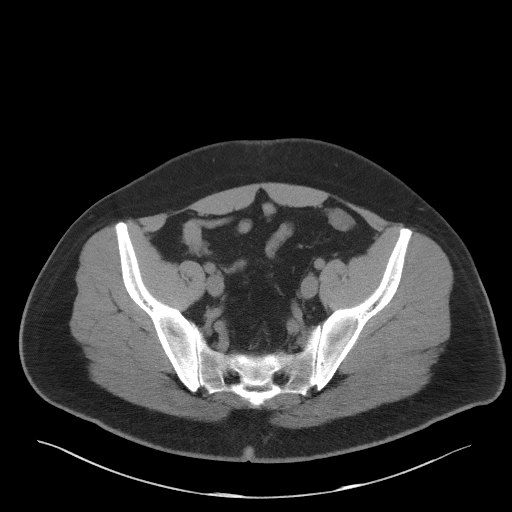
[im 46/108  soft-tissue]
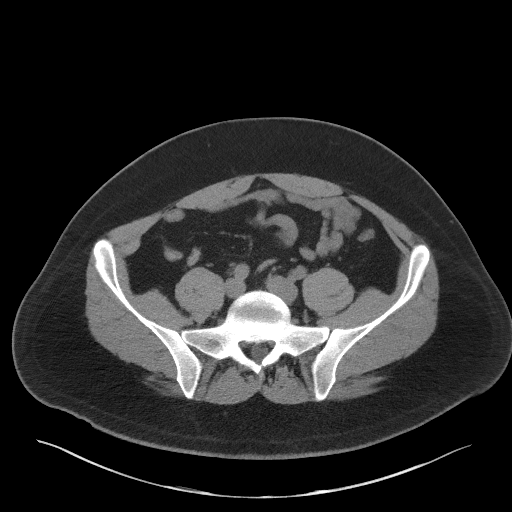
[im 54/108  soft-tissue]
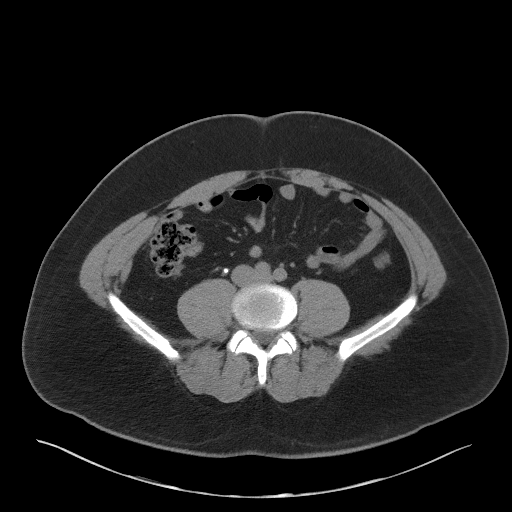
[im 62/108  soft-tissue]
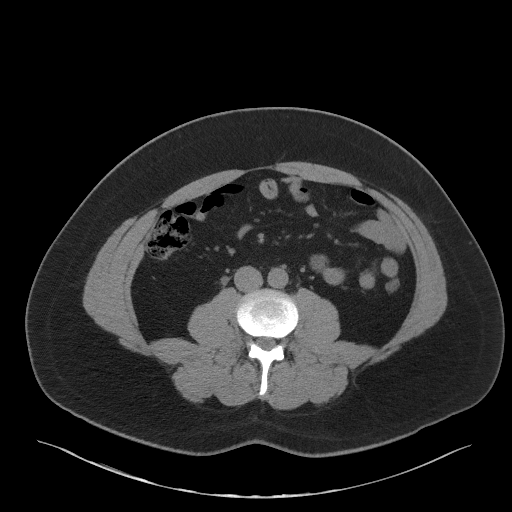
[im 70/108  soft-tissue]
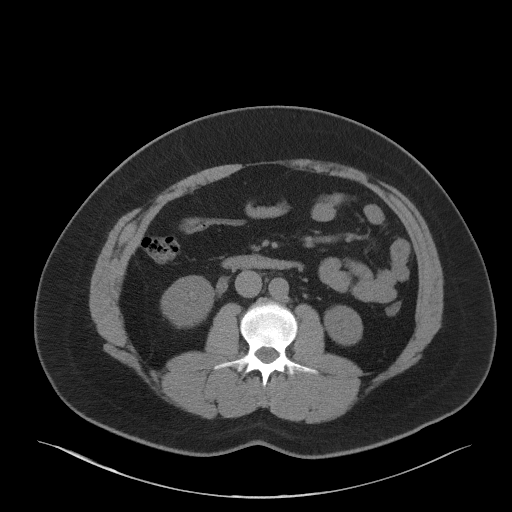
[im 70/108  bone]
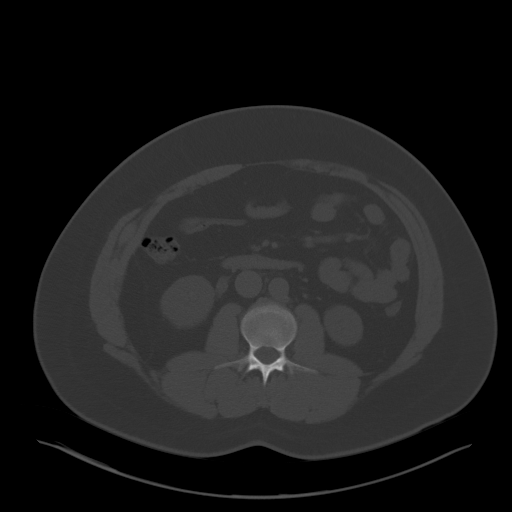
[im 79/108  soft-tissue]
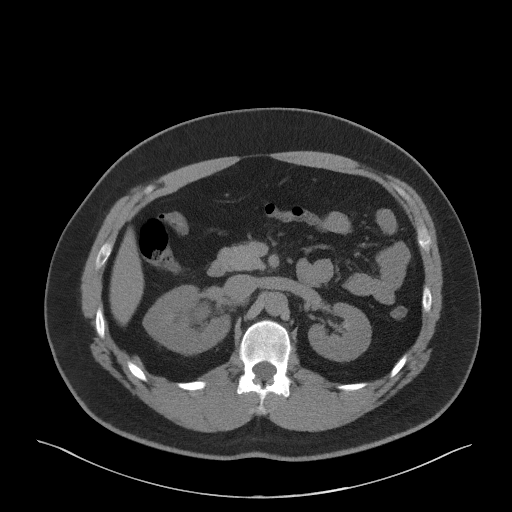
[im 87/108  soft-tissue]
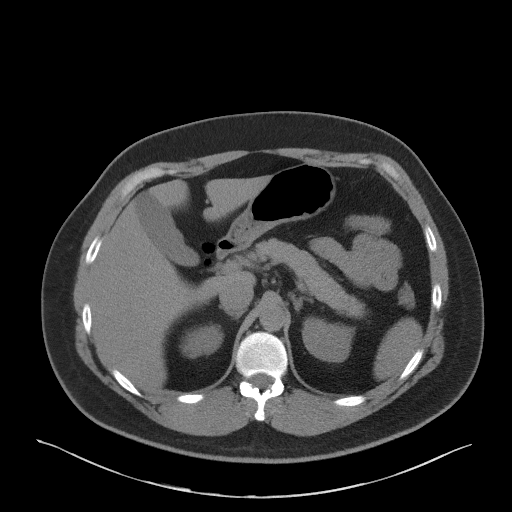
[im 95/108  soft-tissue]
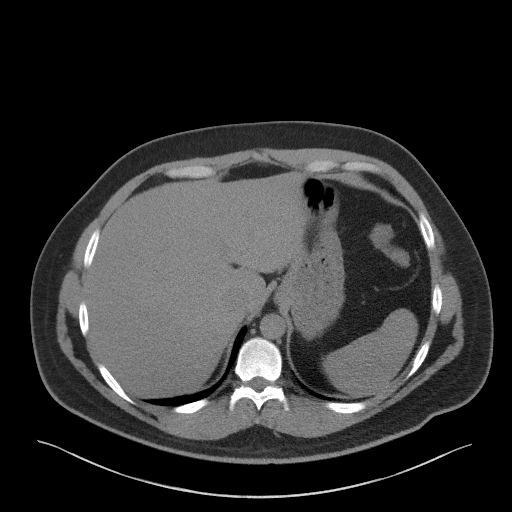
[im 103/108  soft-tissue]
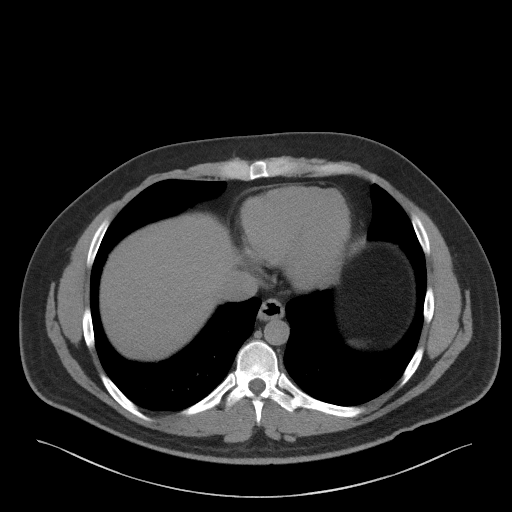

[Series 5: coronal st · coronal · 0.90mm/px · 3 of 95 slices shown]
[im 32/95  soft-tissue]
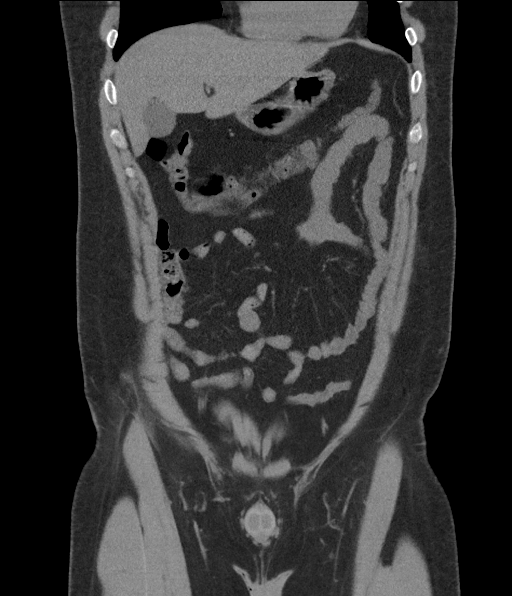
[im 42/95  soft-tissue]
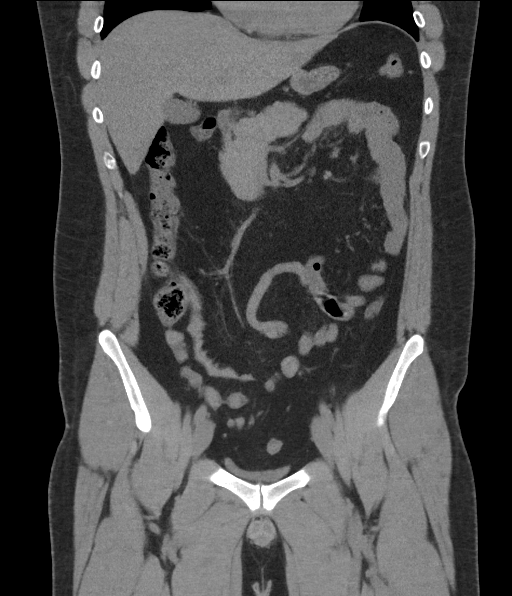
[im 53/95  soft-tissue]
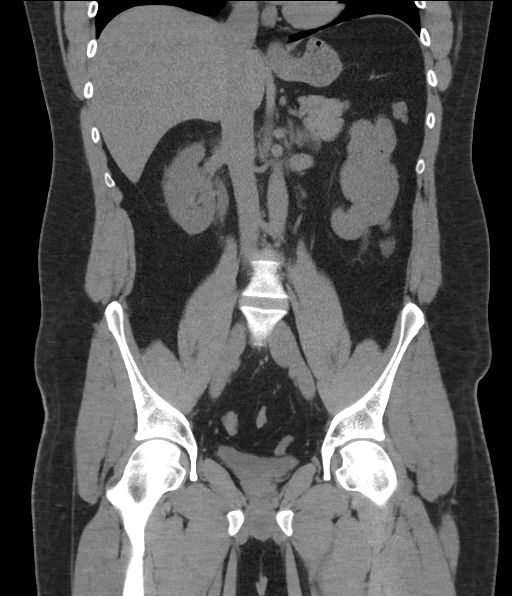

[16 of 46 positions shown; findings below may reference images not displayed]

FINDINGS: Lower chest: No significant pulmonary nodules or acute consolidative
airspace disease.

Hepatobiliary: Normal liver size. Subcentimeter hypodense posterior
right liver lobe lesion (Series 2/image 21), too small to
characterize, not definitely seen on the prior unenhanced CT, which
requires no follow up unless the patient has risk factors for liver
malignancy. No additional liver lesions. Normal gallbladder with no
radiopaque cholelithiasis. No biliary ductal dilatation.

Pancreas: Normal, with no mass or duct dilation.

Spleen: Normal size. No mass.

Adrenals/Urinary Tract: Normal adrenals. Obstructing 5 mm right
lumbar ureteral stone at the L4-5 disc level, with mild to moderate
right hydroureteronephrosis, mild asymmetric enlargement of the
right kidney and minimal right perinephric fat stranding. No stones
within the renal collecting systems. No left hydronephrosis. No
contour deforming renal masses. Normal caliber left ureter. No
additional ureteral stones. Collapsed and grossly normal bladder.

Stomach/Bowel: Grossly normal stomach. Normal caliber small bowel
with no small bowel wall thickening. Normal appendix. Normal large
bowel with no diverticulosis, large bowel wall thickening or
pericolonic fat stranding.

Vascular/Lymphatic: Normal caliber abdominal aorta. No
pathologically enlarged lymph nodes in the abdomen or pelvis.

Reproductive: Normal size prostate.

Other: No pneumoperitoneum, ascites or focal fluid collection.

Musculoskeletal: No aggressive appearing focal osseous lesions.
IMPRESSION: Obstructing 5 mm right lumbar ureteral stone at the L4-5 disc level,
with mild to moderate right hydroureteronephrosis. No additional
urolithiasis.

## 2021-04-22 ENCOUNTER — Ambulatory Visit (INDEPENDENT_AMBULATORY_CARE_PROVIDER_SITE_OTHER): Payer: Commercial Managed Care - PPO | Admitting: Psychiatry

## 2021-04-22 ENCOUNTER — Other Ambulatory Visit (HOSPITAL_COMMUNITY): Payer: Self-pay | Admitting: Psychiatry

## 2021-04-22 ENCOUNTER — Encounter (HOSPITAL_COMMUNITY): Payer: Self-pay | Admitting: Psychiatry

## 2021-04-22 VITALS — BP 120/90 | Temp 97.6°F | Ht 70.0 in | Wt 250.0 lb

## 2021-04-22 DIAGNOSIS — F0630 Mood disorder due to known physiological condition, unspecified: Secondary | ICD-10-CM | POA: Diagnosis not present

## 2021-04-22 DIAGNOSIS — F411 Generalized anxiety disorder: Secondary | ICD-10-CM

## 2021-04-22 DIAGNOSIS — F321 Major depressive disorder, single episode, moderate: Secondary | ICD-10-CM | POA: Diagnosis not present

## 2021-04-22 MED ORDER — LAMOTRIGINE 25 MG PO TABS: 25.0000 mg | ORAL_TABLET | Freq: Every day | ORAL | 0 refills | Status: DC

## 2021-04-22 NOTE — Progress Notes (Signed)
Psychiatric Initial Adult Assessment   Patient Identification: Russell Gilbert MRN:  [id number] Date of Evaluation:  04/22/2021 Referral Source: primary care Chief Complaint:  depression, mood symptoms, establish care Visit Diagnosis:    ICD-10-CM   1. Mood disorder in conditions classified elsewhere  F06.30     2. GAD (generalized anxiety disorder)  F41.1     3. Current moderate episode of major depressive disorder, unspecified whether recurrent (HCC)  F32.1       History of Present Illness: Patient is a 36 years old currently separated Caucasian male with living with his parents he works at [employer] he has a 23-year-old daughter with joint custody referred by primary care physician establish care for possible depression anxiety and mood symptoms  Is a difficulty in the relationship married for 4 years but separated last year after separation has gone through an episode of depression including withdrawn decreased interest sadness and adjusting to separation.  He did start therapy and has been in therapy in the beginning was getting mad agitation.  He has had difficulty in the relationship with both of them adding complex and agitation to each other.  He says therapy has helped it has helped with anxiety and depression and coping with anger.  He is currently taking Lexapro now at a dose of 10 mg he feels Lexapro has also helped the depression and anxiety  He feels that he has been responding with agitation at times or gets mad in the past and he feels that his mom may have bipolar and does how she was with him when he was growing up and wanted to rule out bipolar.  There is no clear psychotic symptoms there is no clear manic symptoms he does have agitation that last for 1 or 2 days but there is no excessive spending psychotic symptoms or spirituality.  He does endorse anxiety excessive worries worries about his relationship but it has gotten better as well with the Lexapro  Aggravating  factors; separation  Modifying factors current job, daughter, friends, goals  Duration more so for the last 1 year  Hospitalization or suicide attempt denies  Alcohol use sporadic now has done at time for 5 drinks a week but now has not been drinking regularly states maybe 1-2 drinks every other weeks       Past Psychiatric History: depression,   Previous Psychotropic Medications: No   Substance Abuse History in the last 12 months:  No.  Consequences of Substance Abuse: NA  Past Medical History:  Past Medical History:  Diagnosis Date   Constipation    History of kidney stones    Hydronephrosis, right    Migraine    Right flank pain    Right ureteral stone     Past Surgical History:  Procedure Laterality Date   CYSTOSCOPY WITH RETROGRADE PYELOGRAM, URETEROSCOPY AND STENT PLACEMENT Right 01/06/2017   Procedure: CYSTOSCOPY WITH RETROGRADE PYELOGRAM, URETEROSCOPY/STONE EXTRACTION/ LASET LITHOTRIPSY AND STENT PLACEMENT;  Surgeon: Rene Paci, MD;  Location: [facility];  Service: Urology;  Laterality: Right;  45 MINS [phone] [id number]   HOLMIUM LASER APPLICATION Right 01/06/2017   Procedure: HOLMIUM LASER APPLICATION;  Surgeon: Rene Paci, MD;  Location[facility];  Service: Urology;  Laterality: Right;   NASAL SEPTOPLASTY W/ TURBINOPLASTY  01-10-2015   dr d. Christell Constant @ [facility]   WISDOM TOOTH EXTRACTION      Family Psychiatric History: mom : says possible mood symptoms, not diagnosed  Family  History:  Family History  Problem Relation Age of Onset   Hypertension Mother    Diabetes Mother     Social History:   Social History   Socioeconomic History   Marital status: Legally Separated    Spouse name: Not on file   Number of children: Not on file   Years of education: Not on file   Highest education level: Not on file  Occupational History   Not on file  Tobacco Use   Smoking status: Never    Smokeless tobacco: Never  Vaping Use   Vaping Use: Never used  Substance and Sexual Activity   Alcohol use: Yes    Comment: occasional   Drug use: No   Sexual activity: Not on file  Other Topics Concern   Not on file  Social History Narrative   Not on file   Social Determinants of Health   Financial Resource Strain: Not on file  Food Insecurity: Not on file  Transportation Needs: Not on file  Physical Activity: Not on file  Stress: Not on file  Social Connections: Not on file    Additional Social History: grew up with parents, sister. Mom was emotional abusive or argumentive, moody   Allergies:  No Known Allergies  Metabolic Disorder Labs: No results found for: HGBA1C, MPG No results found for: PROLACTIN No results found for: CHOL, TRIG, HDL, CHOLHDL, VLDL, LDLCALC No results found for: TSH  Therapeutic Level Labs: No results found for: LITHIUM No results found for: CBMZ No results found for: VALPROATE  Current Medications: Current Outpatient Medications  Medication Sig Dispense Refill   escitalopram (LEXAPRO) 20 MG tablet Take 20 mg by mouth daily.     Galcanezumab-gnlm (EMGALITY, 300 MG DOSE,) 100 MG/ML SOSY Inject the contents of 3 syringes (300mg  total) into the skin at the onset of the cluster period, then inject 3 syringes into the skin monthly until the end of the cluster period.     lamoTRIgine (LAMICTAL) 25 MG tablet Take 1 tablet (25 mg total) by mouth daily. Take one tablet daily for a week and then start taking 2 tablets. 60 tablet 0   SUMAtriptan 6 MG/0.5ML SOAJ SMARTSIG:1 Injection SUB-Q     verapamil (CALAN) 80 MG tablet Take by mouth.     acetaminophen (TYLENOL) 500 MG tablet Take 500 mg by mouth every 6 (six) hours as needed. (Patient not taking: Reported on 04/22/2021)     azithromycin (ZITHROMAX) 250 MG tablet Take 1 tablet (250 mg total) by mouth daily. Take first 2 tablets together, then 1 every day until finished. (Patient not taking: Reported on  04/22/2021) 6 tablet 0   benzonatate (TESSALON) 100 MG capsule Take 1-2 capsules (100-200 mg total) by mouth every 8 (eight) hours. (Patient not taking: Reported on 04/22/2021) 21 capsule 0   HYDROcodone-acetaminophen (NORCO) 5-325 MG tablet Take 1 tablet by mouth every 4 (four) hours as needed for moderate pain. (Patient not taking: Reported on 04/22/2021) 20 tablet 0   Ibuprofen 200 MG CAPS Take by mouth as needed. (Patient not taking: Reported on 04/22/2021)     ketorolac (TORADOL) 10 MG tablet Take 1 tablet (10 mg total) by mouth every 6 (six) hours as needed. (Patient not taking: Reported on 04/22/2021) 20 tablet 0   ondansetron (ZOFRAN ODT) 4 MG disintegrating tablet Take 1 tablet (4 mg total) by mouth every 8 (eight) hours as needed for nausea or vomiting. (Patient not taking: Reported on 04/22/2021) 20 tablet 0   No current  facility-administered medications for this visit.     Psychiatric Specialty Exam: Review of Systems  Cardiovascular:  Negative for chest pain.  Neurological:  Negative for tremors.  Psychiatric/Behavioral:  Positive for agitation. Negative for hallucinations and self-injury.    Blood pressure 120/90, temperature 97.6 F (36.4 C), height 5\' 10"  (1.778 m), weight 250 lb (113.4 kg).Body mass index is 35.87 kg/m.  General Appearance: Casual  Eye Contact:  Good  Speech:  Clear and Coherent  Volume:  Normal  Mood:   somewhat subdued  Affect:  Congruent  Thought Process:  Goal Directed  Orientation:  Full (Time, Place, and Person)  Thought Content:  Rumination  Suicidal Thoughts:  No  Homicidal Thoughts:  No  Memory:  Immediate;   Good  Judgement:  Fair  Insight:  Fair  Psychomotor Activity:  Normal  Concentration:  Attention Span: Fair  Recall:  Good  Fund of Knowledge:Good  Language: Good  Akathisia:  No  Handed:    AIMS (if indicated):  not done  Assets:  Communication Skills Desire for Improvement Financial Resources/Insurance Housing Physical Health   ADL's:  Intact  Cognition: WNL  Sleep:  Fair   Screenings: PHQ2-9    Flowsheet Row Office Visit from [facility]22/2021 [facility] [place]  PHQ-2 Total Score 2  PHQ-9 Total Score 4      Flowsheet Row Office Visit from 04/22/2021 in [facility] AT [place]  C-SSRS RISK CATEGORY No Risk       Assessment and Plan: as follows  Mood disorder unspecified.  No clear history of mania.  Agitation may part of the depression and frustration adapting to the separation as well.  We can start Lamictal 25 mg increasing to 50 mg in 1 week discussed the side effect of rash  Major depressive disorder mild to moderate  Improving with Lexapro can continue 10 mg and therapy  Generalized anxiety disorder; improved the Lexapro can continue says he was having sexual side effects with Lexapro but not having much of that since he has cut down to 10 mg  He still avoids conflict with mom and has a supportive friend he does like his job and spending time with his daughter  04/24/2021 he is adapting to separation and not getting mad and agitated as he has been before with the therapy and coping skills we will add a mood stabilizer as above follow-up in 3 to 4 weeks or earlier if needed  Direct care time spent in office including face to face, chart review, documentation : 48 min   Bluford Main, MD 1/24/20239:38 AM

## 2021-05-23 ENCOUNTER — Other Ambulatory Visit (HOSPITAL_COMMUNITY): Payer: Self-pay | Admitting: Psychiatry

## 2021-05-29 ENCOUNTER — Ambulatory Visit (HOSPITAL_COMMUNITY): Payer: Commercial Managed Care - PPO | Admitting: Psychiatry

## 2021-06-23 ENCOUNTER — Other Ambulatory Visit (HOSPITAL_COMMUNITY): Payer: Self-pay | Admitting: Psychiatry
